# Patient Record
Sex: Female | Born: 1965
Health system: Southern US, Community
[De-identification: ages and names within clinical notes are randomized; demographics above are authoritative.]

## PROBLEM LIST (undated history)

## (undated) DIAGNOSIS — Z860100 Personal history of colon polyps, unspecified: Secondary | ICD-10-CM

## (undated) DIAGNOSIS — B019 Varicella without complication: Secondary | ICD-10-CM

## (undated) HISTORY — DX: Varicella without complication: B01.9

---
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
## 2012-04-15 LAB — HM PAP SMEAR: HM PAP: NORMAL

## 2012-04-15 LAB — HM MAMMOGRAPHY: HM Mammogram: NORMAL

## 2013-07-10 ENCOUNTER — Ambulatory Visit (INDEPENDENT_AMBULATORY_CARE_PROVIDER_SITE_OTHER): Payer: BC Managed Care – PPO | Admitting: Internal Medicine

## 2013-07-10 ENCOUNTER — Encounter: Payer: Self-pay | Admitting: Internal Medicine

## 2013-07-10 ENCOUNTER — Encounter (INDEPENDENT_AMBULATORY_CARE_PROVIDER_SITE_OTHER): Payer: Self-pay

## 2013-07-10 VITALS — BP 118/82 | HR 73 | Temp 98.0°F | Ht 64.0 in | Wt 187.1 lb

## 2013-07-10 DIAGNOSIS — E669 Obesity, unspecified: Secondary | ICD-10-CM | POA: Insufficient documentation

## 2013-07-10 DIAGNOSIS — E785 Hyperlipidemia, unspecified: Secondary | ICD-10-CM | POA: Insufficient documentation

## 2013-07-10 NOTE — Assessment & Plan Note (Signed)
I have addressed  BMI and recommended a low glycemic index diet utilizing smaller more frequent meals to increase metabolism.  I have also recommended that patient start exercising with a goal of 30 minutes of aerobic exercise a minimum of 5 days per week. Screening for lipid disorders, thyroid and diabetes to be done prior to next visit   

## 2013-07-10 NOTE — Patient Instructions (Addendum)
I recommend supplementing yor dietary Vitamin D with 1000 units of D3 daily (can increase to 2000 units during winter months) 1200 mg calcium daily is RDA for premenopusal women  Annual mammogram is recommended;  We will set that up for you and call you   This is my version of a  "Low GI"  Diet:  It will still lower your blood sugars and allow you to lose 4 to 8  lbs  per month if you follow it carefully.  Your goal with exercise is a minimum of 30 minutes of aerobic exercise 5 days per week (Walking does not count once it becomes easy!)    All of the foods can be found at grocery stores and in bulk at [employer].  The Atkins protein bars and shakes are available in more varieties at [employer], [employer] and [place].     7 AM Breakfast:  Choose from the following:  Low carbohydrate Protein  Shakes (I recommend the EAS AdvantEdge "Carb Control" shakes  Or the low carb shakes by Atkins.    2.5 carbs   Arnold's "Sandwhich Thin"toasted  w/ peanut butter (no jelly: about 20 net carbs  "Bagel Thin" with cream cheese and salmon: about 20 carbs   a scrambled egg/bacon/cheese burrito made with Mission's "carb balance" whole wheat tortilla  (about 10 net carbs )   Avoid cereal and bananas, oatmeal and cream of wheat and grits. They are loaded with carbohydrates!   10 AM: high protein snack  Protein bar by Atkins (the snack size, under 200 cal, usually < 6 net carbs).    A stick of cheese:  Around 1 carb,  100 cal     Dannon Light n Fit [place] Yogurt  (80 cal, 8 carbs)  Other so called "protein bars" and Greek yogurts tend to be loaded with carbohydrates.  Remember, in food advertising, the word "energy" is synonymous for " carbohydrate."  Lunch:   A Sandwich using the bread choices listed, Can use any  Eggs,  lunchmeat, grilled meat or canned tuna), avocado, regular mayo/mustard  and cheese.  A Salad using blue cheese, ranch,  Goddess or vinagrette,  No croutons or "confetti" and no "candied nuts"  but regular nuts OK.   No pretzels or chips.  Pickles and miniature sweet peppers are a good low carb alternative that provide a "crunch"  The bread is the only source of carbohydrate in a sandwich and  can be decreased by trying some of these alternatives to traditional loaf bread  Joseph's makes a pita bread and a flat bread that are 50 cal and 4 net carbs available at [place] and [place].  This can be toasted to use with hummous as well  Toufayan makes a low carb flatbread that's 100 cal and 9 net carbs available at [employer] and [employer]'s makes 2 sizes of  Low carb whole wheat tortilla  (The large one is 210 cal and 6 net carbs) Avoid "Low fat dressings, as well as Barry Brunner and [place] dressings They are loaded with sugar!   3 PM/ Mid day  Snack:  Consider  1 ounce of  almonds, walnuts, pistachios, pecans, peanuts,  Macadamia nuts or a nut medley.  Avoid "granola"; the dried cranberries and raisins are loaded with carbohydrates. Mixed nuts as long as there are no raisins,  cranberries or dried fruit.     6 PM  Dinner:     Meat/fowl/fish with a green salad, and  either broccoli, cauliflower, green beans, spinach, brussel sprouts or  Lima beans. DO NOT BREAD THE PROTEIN!!      There is a low carb pasta by Dreamfield's that is acceptable and tastes great: only 5 digestible carbs/serving.( All grocery stores but BJs carry it )  Try Hurley Cisco Angelo's chicken piccata or chicken or eggplant parm over low carb pasta.(Lowes and BJs)   Marjory Lies Sanchez's "Carnitas" (pulled pork, no sauce,  0 carbs) or his beef pot roast to make a dinner burrito (at [employer]J's)  Pesto over low carb pasta (bj's sells a good quality pesto in the center refrigerated section of the deli   Whole wheat pasta is still full of digestible carbs and  Not as low in glycemic index as Dreamfield's.   Brown rice is still rice,  So skip the rice and noodles if you eat [place] or [place] (or at least limit to 1/2 cup)  9 PM snack :    Breyer's "low carb" fudgsicle or  ice cream bar (Carb Smart line), or  Weight Watcher's ice cream bar , or another "no sugar added" ice cream;  a serving of fresh berries/cherries with whipped cream   Cheese or DANNON'S LlGHT N FIT [place] YOGURT  Avoid bananas, pineapple, grapes  and watermelon on a regular basis because they are high in sugar.  THINK OF THEM AS DESSERT  Remember that snack Substitutions should be less than 10 NET carbs per serving and meals < 20 carbs. Remember to subtract fiber grams to get the "net carbs."

## 2013-07-10 NOTE — Assessment & Plan Note (Signed)
Recommended repeating after 3 month of low GI diet.

## 2013-07-10 NOTE — Progress Notes (Signed)
Patient ID: Kathie Dike, female   DOB: Apr 26, 1966, 48 y.o.   MRN: [id number]  Patient Active Problem List   Diagnosis Date Noted  . Other and unspecified hyperlipidemia 07/10/2013  . Obesity, unspecified 07/10/2013    Subjective:  CC:   Chief Complaint  Patient presents with  . Establish Care    new patient    HPI:   Marin Wisner is a 48 y.o. female who presents as a new patient to establish primary care with the chief complaint of  Hyperlipidemia and obesity.    Mild hyperlipidemia and hypertriglyceridemia noted during prior annual exam.   She has lost 8 lbs since January 1st, Working on weight reduction  By removing sweets,  Increasing intake of vegetables and fruits.  Her goal is 150 lbs by summer.  She has a history of successful weight loss 5 yrs ago on the Atkins Diet but the diet was not sustainable because of she missed having fruit,  Coffee and the occasional diet soda.  She is not exercising regularly yet and cooks desserts for husband who does not have a weight problem. Likes to play tennis.     Past Medical History  Diagnosis Date  . Chicken pox as a child    Past Surgical History  Procedure Laterality Date  . Cesarean section  1994  . Wisdom tooth extraction  1985    Family History  Problem Relation Age of Onset  . Cancer Father     prostate  . Dementia Maternal Grandmother   . Heart attack Maternal Grandfather   . Heart attack Paternal Grandfather     History   Social History  . Marital Status: Married    Spouse Name: N/A    Number of Children: N/A  . Years of Education: N/A   Occupational History  . Not on file.   Social History Main Topics  . Smoking status: Never Smoker   . Smokeless tobacco: Never Used  . Alcohol Use: No  . Drug Use: No  . Sexual Activity: Yes    Partners: Male   Other Topics Concern  . Not on file   Social History Narrative  . No narrative on file    No Known Allergies   Review of Systems:   Patient  denies headache, fevers, malaise, unintentional weight loss, skin rash, eye pain, sinus congestion and sinus pain, sore throat, dysphagia,  hemoptysis , cough, dyspnea, wheezing, chest pain, palpitations, orthopnea, edema, abdominal pain, nausea, melena, diarrhea, constipation, flank pain, dysuria, hematuria, urinary  Frequency, nocturia, numbness, tingling, seizures,  Focal weakness, Loss of consciousness,  Tremor, insomnia, depression, anxiety, and suicidal ideation.    Objective:  BP 118/82  Pulse 73  Temp(Src) 98 F (36.7 C) (Oral)  Ht 5\' 4"  (1.626 m)  Wt 187 lb 1.9 oz (84.877 kg)  BMI 32.10 kg/m2  SpO2 97%  LMP 07/05/2013  General appearance: alert, cooperative and appears stated age Ears: normal TM's and external ear canals both ears Throat: lips, mucosa, and tongue normal; teeth and gums normal Neck: no adenopathy, no carotid bruit, supple, symmetrical, trachea midline and thyroid not enlarged, symmetric, no tenderness/mass/nodules Back: symmetric, no curvature. ROM normal. No CVA tenderness. Lungs: clear to auscultation bilaterally Heart: regular rate and rhythm, S1, S2 normal, no murmur, click, rub or gallop Abdomen: soft, non-tender; bowel sounds normal; no masses,  no organomegaly Pulses: 2+ and symmetric Skin: Skin color, texture, turgor normal. No rashes or lesions Lymph nodes: Cervical, supraclavicular, and axillary nodes normal.  Assessment and Plan:  Other and unspecified hyperlipidemia Recommended repeating after 3 month of low GI diet.   Obesity, unspecified I have addressed  BMI and recommended a low glycemic index diet utilizing smaller more frequent meals to increase metabolism.  I have also recommended that patient start exercising with a goal of 30 minutes of aerobic exercise a minimum of 5 days per week. Screening for lipid disorders, thyroid and diabetes to be done prior to next visit    A total of 30 minutes was spent with patient more than half of which  was spent in counseling, and coordination of care.  Updated Medication List Outpatient Encounter Prescriptions as of 07/10/2013  Medication Sig  . Multiple Vitamins-Minerals (WOMENS MULTIVITAMIN PLUS PO) Take 1 tablet by mouth daily.  Marland Kitchen OVER THE COUNTER MEDICATION Mega red- takes every now and then     Orders Placed This Encounter  Procedures  . HM MAMMOGRAPHY  . HM PAP SMEAR    No Follow-up on file.

## 2013-07-10 NOTE — Progress Notes (Signed)
Pre visit review using our clinic review tool, if applicable. No additional management support is needed unless otherwise documented below in the visit note. 

## 2014-05-02 ENCOUNTER — Encounter: Payer: Self-pay | Admitting: Internal Medicine

## 2014-05-02 ENCOUNTER — Ambulatory Visit (INDEPENDENT_AMBULATORY_CARE_PROVIDER_SITE_OTHER): Payer: BC Managed Care – PPO | Admitting: Internal Medicine

## 2014-05-02 ENCOUNTER — Encounter (INDEPENDENT_AMBULATORY_CARE_PROVIDER_SITE_OTHER): Payer: Self-pay

## 2014-05-02 VITALS — BP 120/78 | HR 78 | Temp 98.4°F | Resp 16 | Ht 64.0 in | Wt 180.5 lb

## 2014-05-02 DIAGNOSIS — Z1159 Encounter for screening for other viral diseases: Secondary | ICD-10-CM

## 2014-05-02 DIAGNOSIS — E669 Obesity, unspecified: Secondary | ICD-10-CM

## 2014-05-02 DIAGNOSIS — E785 Hyperlipidemia, unspecified: Secondary | ICD-10-CM

## 2014-05-02 DIAGNOSIS — E559 Vitamin D deficiency, unspecified: Secondary | ICD-10-CM

## 2014-05-02 DIAGNOSIS — R5383 Other fatigue: Secondary | ICD-10-CM

## 2014-05-02 DIAGNOSIS — Z0000 Encounter for general adult medical examination without abnormal findings: Secondary | ICD-10-CM

## 2014-05-02 LAB — CBC WITH DIFFERENTIAL/PLATELET
BASOS ABS: 0 10*3/uL (ref 0.0–0.1)
Basophils Relative: 0.4 % (ref 0.0–3.0)
Eosinophils Absolute: 0.1 10*3/uL (ref 0.0–0.7)
Eosinophils Relative: 1.6 % (ref 0.0–5.0)
HEMATOCRIT: 36.2 % (ref 36.0–46.0)
Hemoglobin: 12.3 g/dL (ref 12.0–15.0)
LYMPHS ABS: 1.4 10*3/uL (ref 0.7–4.0)
Lymphocytes Relative: 22.5 % (ref 12.0–46.0)
MCHC: 34 g/dL (ref 30.0–36.0)
MCV: 97.2 fl (ref 78.0–100.0)
MONO ABS: 0.5 10*3/uL (ref 0.1–1.0)
Monocytes Relative: 8.2 % (ref 3.0–12.0)
Neutro Abs: 4.2 10*3/uL (ref 1.4–7.7)
Neutrophils Relative %: 67.3 % (ref 43.0–77.0)
PLATELETS: 353 10*3/uL (ref 150.0–400.0)
RBC: 3.73 Mil/uL — ABNORMAL LOW (ref 3.87–5.11)
RDW: 12.8 % (ref 11.5–15.5)
WBC: 6.2 10*3/uL (ref 4.0–10.5)

## 2014-05-02 LAB — COMPREHENSIVE METABOLIC PANEL
ALBUMIN: 4.1 g/dL (ref 3.5–5.2)
ALT: 12 U/L (ref 0–35)
AST: 16 U/L (ref 0–37)
Alkaline Phosphatase: 54 U/L (ref 39–117)
BUN: 14 mg/dL (ref 6–23)
CALCIUM: 8.7 mg/dL (ref 8.4–10.5)
CO2: 27 meq/L (ref 19–32)
CREATININE: 0.8 mg/dL (ref 0.4–1.2)
Chloride: 105 mEq/L (ref 96–112)
GFR: 87.21 mL/min (ref >60.00)
Glucose, Bld: 93 mg/dL (ref 70–99)
POTASSIUM: 4.2 meq/L (ref 3.5–5.1)
Sodium: 143 mEq/L (ref 135–145)
TOTAL PROTEIN: 7.1 g/dL (ref 6.0–8.3)
Total Bilirubin: 0.8 mg/dL (ref 0.2–1.2)

## 2014-05-02 LAB — LIPID PANEL
CHOL/HDL RATIO: 4
Cholesterol: 212 mg/dL — ABNORMAL HIGH (ref 0–200)
HDL: 51.7 mg/dL (ref >39.00)
LDL CALC: 131 mg/dL — AB (ref 0–99)
NonHDL: 160.3
Triglycerides: 146 mg/dL (ref 0.0–149.0)
VLDL: 29.2 mg/dL (ref 0.0–40.0)

## 2014-05-02 LAB — VITAMIN D 25 HYDROXY (VIT D DEFICIENCY, FRACTURES): VITD: 29.03 ng/mL — ABNORMAL LOW (ref 30.00–100.00)

## 2014-05-02 LAB — TSH: TSH: 1.14 u[IU]/mL (ref 0.35–4.50)

## 2014-05-02 NOTE — Progress Notes (Signed)
Pre-visit discussion using our clinic review tool. No additional management support is needed unless otherwise documented below in the visit note.  

## 2014-05-02 NOTE — Patient Instructions (Signed)
You had your annual  wellness exam today.  We will repeat your PAP smear in 2016   We will schedule your mammogram annually when it is due .     We will contact you with the bloodwork results

## 2014-05-03 LAB — HEPATITIS C ANTIBODY: HCV Ab: NEGATIVE

## 2014-05-04 ENCOUNTER — Encounter: Payer: Self-pay | Admitting: *Deleted

## 2014-05-05 DIAGNOSIS — Z0000 Encounter for general adult medical examination without abnormal findings: Secondary | ICD-10-CM | POA: Insufficient documentation

## 2014-05-05 DIAGNOSIS — Z0001 Encounter for general adult medical examination with abnormal findings: Secondary | ICD-10-CM | POA: Insufficient documentation

## 2014-05-05 NOTE — Assessment & Plan Note (Signed)
New ACC guidelines recommend starting patients aged 49 or higher on moderate intensity statin therapy for LDL between 70-189 and 10 yr risk of CAD > 7.5% ;  and high intensity therapy for anyone with LDL > 190 Based on these guidelines, she does not require therapy.  I have addressed  BMI and recommended a low glycemic index diet utilizing smaller more frequent meals to increase metabolism.  I have also recommended that patient start exercising with a goal of 30 minutes of aerobic exercise a minimum of 5 days per week.    Lab Results  Component Value Date   CHOL 212* 05/02/2014   HDL 51.70 05/02/2014   LDLCALC 131* 05/02/2014   TRIG 146.0 05/02/2014   CHOLHDL 4 05/02/2014

## 2014-05-05 NOTE — Assessment & Plan Note (Signed)
Annual wellness  exam was done as well as a comprehensive physical exam .  During the course of the visit the patient was educated and counseled about appropriate screening and preventive services including :  diabetes screening, lipid analysis with projected  10 year  risk for CAD , nutrition counseling, colorectal cancer screening, and recommended immunizations.  Printed recommendations for health maintenance screenings was given.   

## 2014-05-05 NOTE — Progress Notes (Signed)
Patient ID: Natalie Barr, female   DOB: [birth year], 48 y.o.   MRN: [id number]    Subjective:     Natalie Barr is a 47 y.o. female and is here for a comprehensive physical exam. The patient reports no problems.  History   Social History  . Marital Status: Married    Spouse Name: N/A    Number of Children: N/A  . Years of Education: N/A   Occupational History  . Not on file.   Social History Main Topics  . Smoking status: Never Smoker   . Smokeless tobacco: Never Used  . Alcohol Use: No  . Drug Use: No  . Sexual Activity:    Partners: Male   Other Topics Concern  . Not on file   Social History Narrative   Health Maintenance  Topic Date Due  . INFLUENZA VACCINE  01/14/2015  . PAP SMEAR  04/16/2015  . TETANUS/TDAP  07/11/2019    The following portions of the patient's history were reviewed and updated as appropriate: allergies, current medications, past family history, past medical history, past social history, past surgical history and problem list.  Review of Systems A comprehensive review of systems was negative.   Objective:   BP 120/78 mmHg  Pulse 78  Temp(Src) 98.4 F (36.9 C) (Oral)  Resp 16  Ht 5\' 4"  (1.626 m)  Wt 180 lb 8 oz (81.874 kg)  BMI 30.97 kg/m2  SpO2 98%  LMP 04/25/2014 (Approximate)  General appearance: alert, cooperative and appears stated age Head: Normocephalic, without obvious abnormality, atraumatic Eyes: conjunctivae/corneas clear. PERRL, EOM's intact. Fundi benign. Ears: normal TM's and external ear canals both ears Nose: Nares normal. Septum midline. Mucosa normal. No drainage or sinus tenderness. Throat: lips, mucosa, and tongue normal; teeth and gums normal Neck: no adenopathy, no carotid bruit, no JVD, supple, symmetrical, trachea midline and thyroid not enlarged, symmetric, no tenderness/mass/nodules Lungs: clear to auscultation bilaterally Breasts: normal appearance, no masses or tenderness Heart: regular rate and rhythm,  S1, S2 normal, no murmur, click, rub or gallop Abdomen: soft, non-tender; bowel sounds normal; no masses,  no organomegaly Extremities: extremities normal, atraumatic, no cyanosis or edema Pulses: 2+ and symmetric Skin: Skin color, texture, turgor normal. No rashes or lesions Neurologic: Alert and oriented X 3, normal strength and tone. Normal symmetric reflexes. Normal coordination and gait.   .    Assessment and Plan:   Obesity I have addressed  BMI and recommended a low glycemic index diet utilizing smaller more frequent meals to increase metabolism.  I have also recommended that patient start exercising with a goal of 30 minutes of aerobic exercise a minimum of 5 days per week. Screening for lipid disorders, thyroid and diabetes to be done today.    Hyperlipidemia LDL goal <160 New ACC guidelines recommend starting patients aged 18 or higher on moderate intensity statin therapy for LDL between 70-189 and 10 yr risk of CAD > 7.5% ;  and high intensity therapy for anyone with LDL > 190 Based on these guidelines, she does not require therapy.  I have addressed  BMI and recommended a low glycemic index diet utilizing smaller more frequent meals to increase metabolism.  I have also recommended that patient start exercising with a goal of 30 minutes of aerobic exercise a minimum of 5 days per week.    Lab Results  Component Value Date   CHOL 212* 05/02/2014   HDL 51.70 05/02/2014   LDLCALC 131* 05/02/2014   TRIG 146.0 05/02/2014  CHOLHDL 4 05/02/2014     Encounter for preventive health examination Annual wellness  exam was done as well as a comprehensive physical exam  .  During the course of the visit the patient was educated and counseled about appropriate screening and preventive services including :  diabetes screening, lipid analysis with projected  10 year  risk for CAD , nutrition counseling, colorectal cancer screening, and recommended immunizations.  Printed recommendations for  health maintenance screenings was given.     Updated Medication List Outpatient Encounter Prescriptions as of 05/02/2014  Medication Sig  . Multiple Vitamins-Minerals (WOMENS MULTIVITAMIN PLUS PO) Take 1 tablet by mouth daily.  Marland Kitchen OVER THE COUNTER MEDICATION Mega red- takes every now and then

## 2014-05-05 NOTE — Assessment & Plan Note (Signed)
I have addressed  BMI and recommended a low glycemic index diet utilizing smaller more frequent meals to increase metabolism.  I have also recommended that patient start exercising with a goal of 30 minutes of aerobic exercise a minimum of 5 days per week. Screening for lipid disorders, thyroid and diabetes to be done today.   

## 2015-05-24 ENCOUNTER — Encounter: Payer: BC Managed Care – PPO | Admitting: Internal Medicine

## 2015-07-08 ENCOUNTER — Encounter: Payer: Self-pay | Admitting: Internal Medicine

## 2015-08-02 ENCOUNTER — Encounter: Payer: Self-pay | Admitting: Internal Medicine

## 2015-08-23 ENCOUNTER — Other Ambulatory Visit (HOSPITAL_COMMUNITY)
Admission: RE | Admit: 2015-08-23 | Discharge: 2015-08-23 | Disposition: A | Discharge status: Home / Self Care | Payer: BLUE CROSS/BLUE SHIELD | Source: Ambulatory Visit | Attending: Internal Medicine | Admitting: Internal Medicine

## 2015-08-23 ENCOUNTER — Encounter: Payer: Self-pay | Admitting: Internal Medicine

## 2015-08-23 ENCOUNTER — Ambulatory Visit (INDEPENDENT_AMBULATORY_CARE_PROVIDER_SITE_OTHER): Payer: BLUE CROSS/BLUE SHIELD | Admitting: Internal Medicine

## 2015-08-23 VITALS — BP 128/80 | HR 91 | Temp 98.0°F | Resp 12 | Ht 63.5 in | Wt 193.8 lb

## 2015-08-23 DIAGNOSIS — R5383 Other fatigue: Secondary | ICD-10-CM | POA: Diagnosis not present

## 2015-08-23 DIAGNOSIS — Z1151 Encounter for screening for human papillomavirus (HPV): Secondary | ICD-10-CM | POA: Insufficient documentation

## 2015-08-23 DIAGNOSIS — Z1159 Encounter for screening for other viral diseases: Secondary | ICD-10-CM

## 2015-08-23 DIAGNOSIS — Z1239 Encounter for other screening for malignant neoplasm of breast: Secondary | ICD-10-CM

## 2015-08-23 DIAGNOSIS — E669 Obesity, unspecified: Secondary | ICD-10-CM

## 2015-08-23 DIAGNOSIS — Z124 Encounter for screening for malignant neoplasm of cervix: Secondary | ICD-10-CM

## 2015-08-23 DIAGNOSIS — Z114 Encounter for screening for human immunodeficiency virus [HIV]: Secondary | ICD-10-CM

## 2015-08-23 DIAGNOSIS — Z0000 Encounter for general adult medical examination without abnormal findings: Secondary | ICD-10-CM

## 2015-08-23 DIAGNOSIS — E785 Hyperlipidemia, unspecified: Secondary | ICD-10-CM

## 2015-08-23 DIAGNOSIS — E559 Vitamin D deficiency, unspecified: Secondary | ICD-10-CM | POA: Diagnosis not present

## 2015-08-23 DIAGNOSIS — Z01419 Encounter for gynecological examination (general) (routine) without abnormal findings: Secondary | ICD-10-CM | POA: Insufficient documentation

## 2015-08-23 LAB — COMPREHENSIVE METABOLIC PANEL
ALT: 15 U/L (ref 0–35)
AST: 16 U/L (ref 0–37)
Albumin: 4.3 g/dL (ref 3.5–5.2)
Alkaline Phosphatase: 65 U/L (ref 39–117)
BUN: 13 mg/dL (ref 6–23)
CALCIUM: 9.4 mg/dL (ref 8.4–10.5)
CHLORIDE: 101 meq/L (ref 96–112)
CO2: 28 meq/L (ref 19–32)
Creatinine, Ser: 0.7 mg/dL (ref 0.40–1.20)
GFR: 93.94 mL/min (ref >60.00)
GLUCOSE: 96 mg/dL (ref 70–99)
POTASSIUM: 3.9 meq/L (ref 3.5–5.1)
Sodium: 138 mEq/L (ref 135–145)
Total Bilirubin: 0.4 mg/dL (ref 0.2–1.2)
Total Protein: 7.6 g/dL (ref 6.0–8.3)

## 2015-08-23 LAB — CBC WITH DIFFERENTIAL/PLATELET
BASOS PCT: 0.4 % (ref 0.0–3.0)
Basophils Absolute: 0 10*3/uL (ref 0.0–0.1)
EOS PCT: 1.4 % (ref 0.0–5.0)
Eosinophils Absolute: 0.1 10*3/uL (ref 0.0–0.7)
HEMATOCRIT: 38.8 % (ref 36.0–46.0)
HEMOGLOBIN: 13.2 g/dL (ref 12.0–15.0)
LYMPHS PCT: 25.1 % (ref 12.0–46.0)
Lymphs Abs: 1.9 10*3/uL (ref 0.7–4.0)
MCHC: 34 g/dL (ref 30.0–36.0)
MCV: 95.9 fl (ref 78.0–100.0)
MONOS PCT: 7.7 % (ref 3.0–12.0)
Monocytes Absolute: 0.6 10*3/uL (ref 0.1–1.0)
Neutro Abs: 4.9 10*3/uL (ref 1.4–7.7)
Neutrophils Relative %: 65.4 % (ref 43.0–77.0)
Platelets: 369 10*3/uL (ref 150.0–400.0)
RBC: 4.05 Mil/uL (ref 3.87–5.11)
RDW: 12.7 % (ref 11.5–15.5)
WBC: 7.5 10*3/uL (ref 4.0–10.5)

## 2015-08-23 LAB — LIPID PANEL
Cholesterol: 241 mg/dL — ABNORMAL HIGH (ref 0–200)
HDL: 60.6 mg/dL (ref >39.00)
LDL Cholesterol: 157 mg/dL — ABNORMAL HIGH (ref 0–99)
NonHDL: 180.1
Total CHOL/HDL Ratio: 4
Triglycerides: 118 mg/dL (ref 0.0–149.0)
VLDL: 23.6 mg/dL (ref 0.0–40.0)

## 2015-08-23 LAB — VITAMIN D 25 HYDROXY (VIT D DEFICIENCY, FRACTURES): VITD: 23.89 ng/mL — ABNORMAL LOW (ref 30.00–100.00)

## 2015-08-23 LAB — TSH: TSH: 0.99 u[IU]/mL (ref 0.35–4.50)

## 2015-08-23 NOTE — Progress Notes (Signed)
Patient ID: Natalie Barr, female    DOB: [birth year]  Age: 50 y.o. MRN: [id number]  The patient is here for annual Medicare wellness examination and management of other chronic and acute problems.   The risk factors are reflected in the social history.  The roster of all physicians providing medical care to patient - is listed in the Snapshot section of the chart.  Activities of daily living:  The patient is 100% independent in all ADLs: dressing, toileting, feeding as well as independent mobility  Home safety : The patient has smoke detectors in the home. They wear seatbelts.  There are no firearms at home. There is no violence in the home.   There is no risks for hepatitis, STDs or HIV. There is no   history of blood transfusion. They have no travel history to infectious disease endemic areas of the world.  The patient has seen their dentist in the last six month. They have seen their eye doctor in the last year. They admit to slight hearing difficulty with regard to whispered voices and some television programs.  They have deferred audiologic testing in the last year.  They do not  have excessive sun exposure. Discussed the need for sun protection: hats, long sleeves and use of sunscreen if there is significant sun exposure.   Diet: the importance of a healthy diet is discussed. They do have a healthy diet.  The benefits of regular aerobic exercise were discussed. She walks 4 times per week ,  20 minutes.   Depression screen: there are no signs or vegative symptoms of depression- irritability, change in appetite, anhedonia, sadness/tearfullness.  Cognitive assessment: the patient manages all their financial and personal affairs and is actively engaged. They could relate day,date,year and events; recalled 2/3 objects at 3 minutes; performed clock-face test normally.  The following portions of the patient's history were reviewed and updated as appropriate: allergies, current medications, past  family history, past medical history,  past surgical history, past social history  and problem list.  Visual acuity was not assessed per patient preference since she has regular follow up with her ophthalmologist. Hearing and body mass index were assessed and reviewed.   During the course of the visit the patient was educated and counseled about appropriate screening and preventive services including : fall prevention , diabetes screening, nutrition counseling, colorectal cancer screening, and recommended immunizations.    CC: The primary encounter diagnosis was Breast cancer screening. Diagnoses of Hyperlipidemia, Other fatigue, Vitamin D deficiency, Need for hepatitis C screening test, Screening for HIV (human immunodeficiency virus), Cervical cancer screening, Encounter for preventive health examination, Obesity, and Hyperlipidemia LDL goal <160 were also pertinent to this visit.  History Natalie Barr has a past medical history of Chicken pox (as a child).   She has past surgical history that includes Cesarean section (1994) and Wisdom tooth extraction (1985).   Her family history includes Cancer in her father; Dementia in her maternal grandmother; Heart attack in her maternal grandfather and paternal grandfather.She reports that she has never smoked. She has never used smokeless tobacco. She reports that she does not drink alcohol or use illicit drugs.  Outpatient Prescriptions Prior to Visit  Medication Sig Dispense Refill  . Multiple Vitamins-Minerals (WOMENS MULTIVITAMIN PLUS PO) Take 1 tablet by mouth daily.    Marland Kitchen OVER THE COUNTER MEDICATION Reported on 08/23/2015     No facility-administered medications prior to visit.    Review of Systems   Patient denies headache, fevers, malaise, unintentional  weight loss, skin rash, eye pain, sinus congestion and sinus pain, sore throat, dysphagia,  hemoptysis , cough, dyspnea, wheezing, chest pain, palpitations, orthopnea, edema, abdominal pain, nausea,  melena, diarrhea, constipation, flank pain, dysuria, hematuria, urinary  Frequency, nocturia, numbness, tingling, seizures,  Focal weakness, Loss of consciousness,  Tremor, insomnia, depression, anxiety, and suicidal ideation.      Objective:  BP 128/80 mmHg  Pulse 91  Temp(Src) 98 F (36.7 C) (Oral)  Resp 12  Ht 5' 3.5" (1.613 m)  Wt 193 lb 12 oz (87.884 kg)  BMI 33.78 kg/m2  SpO2 98%  LMP 08/14/2015 (Approximate)  Physical Exam   General Appearance:    Alert, cooperative, no distress, appears stated age  Head:    Normocephalic, without obvious abnormality, atraumatic  Eyes:    PERRL, conjunctiva/corneas clear, EOM's intact, fundi    benign, both eyes  Ears:    Normal TM's and external ear canals, both ears  Nose:   Nares normal, septum midline, mucosa normal, no drainage    or sinus tenderness  Throat:   Lips, mucosa, and tongue normal; teeth and gums normal  Neck:   Supple, symmetrical, trachea midline, no adenopathy;    thyroid:  no enlargement/tenderness/nodules; no carotid   bruit or JVD  Back:     Symmetric, no curvature, ROM normal, no CVA tenderness  Lungs:     Clear to auscultation bilaterally, respirations unlabored  Chest Wall:    No tenderness or deformity   Heart:    Regular rate and rhythm, S1 and S2 normal, no murmur, rub   or gallop  Breast Exam:    No tenderness, masses, or nipple abnormality  Abdomen:     Soft, non-tender, bowel sounds active all four quadrants,    no masses, no organomegaly  Genitalia:    Pelvic: cervix normal in appearance, external genitalia normal, no adnexal masses or tenderness, no cervical motion tenderness, rectovaginal septum normal, uterus normal size, shape, and consistency and vagina normal without discharge  Extremities:   Extremities normal, atraumatic, no cyanosis or edema  Pulses:   2+ and symmetric all extremities  Skin:   Skin color, texture, turgor normal, no rashes or lesions  Lymph nodes:   Cervical, supraclavicular,  and axillary nodes normal  Neurologic:   CNII-XII intact, normal strength, sensation and reflexes    throughout     Assessment & Plan:   Problem List Items Addressed This Visit    Encounter for preventive health examination (Chronic)    Annual comprehensive preventive exam was done as well as an evaluation and management of chronic conditions .  During the course of the visit the patient was educated and counseled about appropriate screening and preventive services including :  diabetes screening, lipid analysis with projected  10 year  risk for CAD  Which is 4.1 % using the Framingham risk calculator for women, , nutrition counseling, colorectal cancer screening, and recommended immunizations.  Printed recommendations for health maintenance screenings was given.        Hyperlipidemia LDL goal <160    LDL and triglycerides are at goal without medications. 10 yr risk of CAD  is 5.1%       Obesity    I have addressed  BMI and recommended wt loss of 10% of body weight over the next 6 months using a low glycemic index diet and regular exercise a minimum of 5 days per week.         Other Visit Diagnoses  Breast cancer screening    -  Primary    Relevant Orders    MM DIGITAL SCREENING BILATERAL    Hyperlipidemia        Relevant Orders    Lipid panel (Completed)    Other fatigue        Relevant Orders    Comprehensive metabolic panel (Completed)    TSH (Completed)    CBC with Differential/Platelet (Completed)    Vitamin D deficiency        Relevant Orders    VITAMIN D 25 Hydroxy (Vit-D Deficiency, Fractures) (Completed)    Need for hepatitis C screening test        Relevant Orders    Hepatitis C antibody (Completed)    Screening for HIV (human immunodeficiency virus)        Relevant Orders    HIV antibody (Completed)    Cervical cancer screening        Relevant Orders    Cytology - PAP       I have discontinued Ms. Dillingham's OVER THE COUNTER MEDICATION. I am also having  her maintain her Multiple Vitamins-Minerals (WOMENS MULTIVITAMIN PLUS PO).  No orders of the defined types were placed in this encounter.    Medications Discontinued During This Encounter  Medication Reason  . OVER THE COUNTER MEDICATION Error    Follow-up: No Follow-up on file.   Crecencio Mc, MD

## 2015-08-23 NOTE — Progress Notes (Signed)
Pre-visit discussion using our clinic review tool. No additional management support is needed unless otherwise documented below in the visit note.  

## 2015-08-23 NOTE — Patient Instructions (Signed)
This is my  example of a  "Low GI"  Diet:  It will allow you to lose 4 to 8  lbs  per month if you follow it carefully.  Your goal with exercise is a minimum of 30 minutes of aerobic exercise 5 days per week (Walking does not count once it becomes easy!)    All of the foods can be found at grocery stores and in bulk at [employer].  The Atkins protein bars and shakes are available in more varieties at [employer], [employer] and [place].     7 AM Breakfast:  Choose from the following:  Low carbohydrate Protein  Shakes (I recommend the  Premier Protein chocolate shake, s EAS AdvantEdge "Carb Control" shakes  Or the low carb shakes by Atkins.    2.5 carbs)   Arnold's "Sandwhich Thin"toasted  w/ peanut butter (no jelly: about 20 net carbs  "Bagel Thin" with cream cheese and salmon: about 20 carbs   a scrambled egg/bacon/cheese burrito made with Mission's "carb balance" whole wheat tortilla  (about 10 net carbs )  Regulatory affairs officer (basically a quiche without the pastry crust) that is eaten cold and very convenient way to get your eggs  If you make your own shakes, avoid bananas and pineapple,  And use low carb greek yogurt or almond milk    Avoid cereal and bananas, oatmeal and cream of wheat and grits. They are loaded with carbohydrates!   10 AM: high protein snack:  Protein bar by Atkins (the snack size, under 200 cal, usually < 6 net carbs).    A stick of cheese:  Around 1 carb,  100 cal     Dannon Light n Fit [place] Yogurt  (80 cal, 8 carbs)  Other so called "protein bars" and Greek yogurts tend to be loaded with carbohydrates.  Remember, in food advertising, the word "energy" is synonymous for " carbohydrate."  Lunch:   A Sandwich using the bread choices listed, Can use any  Eggs,  lunchmeat, grilled meat or canned tuna), avocado, regular mayo/mustard  and cheese.  A Salad using blue cheese, ranch,  Goddess or vinagrette,  Avoid taco shells, croutons or "confetti" and no  "candied nuts" but regular nuts OK.   No pretzels, nabs  or chips.  Pickles and miniature sweet peppers are a good low carb alternative that provide a "crunch"  The bread is the only source of carbohydrate in a sandwich and  can be decreased by trying some of these alternatives to traditional loaf bread:  Joseph's makes a pita bread and a flat bread that are 50 cal and 4 net carbs available at [place] and [place].  This can be toasted to use with hummous as well  Toufayan makes a low carb flatbread that's 100 cal and 9 net carbs available at [employer] and [employer]J's makes 2 sizes of  Low carb whole wheat tortilla  (The large one is 210 cal and 6 net carbs)  Ezekiel bread is a loaf bread sold in the frozen section of higher end grocery chains,  Very low carb  Avoid "Low fat dressings, as well as Barry Brunner and [place] dressings They are loaded with sugar!   3 PM/ Mid day  Snack:  Consider  1 ounce of  almonds, walnuts, pistachios, pecans, peanuts,  Macadamia nuts or a nut medley.  Avoid "granola"; the dried cranberries and raisins are loaded with carbohydrates. Mixed nuts as long as there  are no raisins,  cranberries or dried fruit.    Try the prosciutto/mozzarella cheese sticks by Fiorruci  In deli /backery section   High protein      6 PM  Dinner:     Meat/fowl/fish with a green salad, and either broccoli, cauliflower, green beans, spinach, brussel sprouts or  Lima beans. DO NOT BREAD THE PROTEIN!!      There is a low carb pasta by Dreamfield's that is acceptable and tastes great: only 5 digestible carbs/serving.( All grocery stores but BJs carry it )  Try Hurley Cisco Angelo's chicken piccata or chicken or eggplant parm over low carb pasta.(Lowes and BJs)   Marjory Lies Sanchez's "Carnitas" (pulled pork, no sauce,  0 carbs) or his beef pot roast to make a dinner burrito (at BJ's)  Pesto over low carb pasta (bj's sells a good quality pesto in the center refrigerated section of the deli   Try  satueeing  Cheral Marker with mushroooms  Whole wheat pasta is still full of digestible carbs and  Not as low in glycemic index as Dreamfield's.   Brown rice is still rice,  So skip the rice and noodles if you eat [place] or [place] (or at least limit to 1/2 cup)  9 PM snack :   Breyer's "low carb" fudgsicle or  ice cream bar (Carb Smart line), or  Weight Watcher's ice cream bar , or another "no sugar added" ice cream;  a serving of fresh berries/cherries with whipped cream   Cheese or DANNON'S LlGHT N FIT GREEK YOGURT  8 ounces of [place] unsweetened almond/cococunut milk    Treat yourself to a parfait made with whipped cream blueberiies, walnuts and vanilla greek yogurt  Avoid bananas, pineapple, grapes  and watermelon on a regular basis because they are high in sugar.  THINK OF THEM AS DESSERT  Remember that snack Substitutions should be less than 10 NET carbs per serving and meals < 20 carbs. Remember to subtract fiber grams to get the "net carbs."    DIET #2  THE GREEN SMOOTHIE DIET  The  Other diet I discussed with you today is the 10 day Green Smoothie Cleansing /Detox Diet by Linden Dolin . available on [place] for around $10.  It does require a blender, (Vita Mix, a electric juicer,  Or a Nutribullet Rx).  This is not a low carb or a weight loss diet,  It is fundamentally a "cleansing" low fat diet that eliminates sugar, gluten, caffeine, alcohol and dairy for 10 days .  What you add back after the initial ten days is entirely up to  you!  You can expect to lose 5 to 10 lbs depending on how strict you are.   I found that  drinking 2 smoothies or juices  daily and keeping one chewable meal (but keep it simple, like baked fish and salad, rice or bok choy) kept me satisfied and kept me from straying  .  You snack primarily on fresh  fruit, egg whites and judicious quantities of nuts.  You can add a  vegetable based protein powder  to any smoothie made with almond milk (nothing with whey ,  since whey is dairy)  WalMart has a few but  the [employer] has the greatest  selection .  Using frozen fruits is much more convenient and cost effective. You can even find plenty of organic fruit in the frozen fruit section of BJS's.  Just thaw what you need for the following day  the night before in the refrigerator (to avoid jamming up your machine)   The organic vegan protein powder I tried  is called Vega" and I found it at [employer] .  It is sugar free. Tastes like crap.  My advice:  Sarina Ser your protein  (eat an egg or two in the am with your smoothie or add soy yogurt for protein ) ,  Don't ruin the taste of your smoothies with protein powder unless you can find one you really love.

## 2015-08-24 LAB — HEPATITIS C ANTIBODY: HCV AB: NEGATIVE

## 2015-08-24 LAB — HIV ANTIBODY (ROUTINE TESTING W REFLEX): HIV: NONREACTIVE

## 2015-08-25 DIAGNOSIS — E559 Vitamin D deficiency, unspecified: Secondary | ICD-10-CM | POA: Insufficient documentation

## 2015-08-25 MED ORDER — ERGOCALCIFEROL 1.25 MG (50000 UT) PO CAPS: 50000.0000 [IU] | ORAL_CAPSULE | ORAL | Status: DC

## 2015-08-25 NOTE — Assessment & Plan Note (Signed)
Annual comprehensive preventive exam was done as well as an evaluation and management of chronic conditions .  During the course of the visit the patient was educated and counseled about appropriate screening and preventive services including :  diabetes screening, lipid analysis with projected  10 year  risk for CAD  Which is 4.1 % using the Framingham risk calculator for women, , nutrition counseling, colorectal cancer screening, and recommended immunizations.  Printed recommendations for health maintenance screenings was given.

## 2015-08-25 NOTE — Assessment & Plan Note (Signed)
LDL and triglycerides are at goal without medications. 10 yr risk of CAD  is 5.1%

## 2015-08-25 NOTE — Assessment & Plan Note (Signed)
I have addressed  BMI and recommended wt loss of 10% of body weight over the next 6 months using a low glycemic index diet and regular exercise a minimum of 5 days per week.   

## 2015-08-26 ENCOUNTER — Encounter: Payer: Self-pay | Admitting: *Deleted

## 2015-08-26 LAB — CYTOLOGY - PAP

## 2015-09-03 NOTE — Addendum Note (Signed)
Addended by: Kerin Salen R on: 09/03/2015 11:01 AM   Modules accepted: Miquel Dunn

## 2015-09-03 NOTE — Progress Notes (Signed)
Cologuard ordered

## 2015-09-23 LAB — COLOGUARD: Cologuard: NEGATIVE

## 2015-10-11 ENCOUNTER — Encounter: Payer: Self-pay | Admitting: Internal Medicine

## 2015-10-14 ENCOUNTER — Encounter: Payer: Self-pay | Admitting: *Deleted

## 2015-10-14 ENCOUNTER — Telehealth: Payer: Self-pay | Admitting: Internal Medicine

## 2015-10-14 NOTE — Telephone Encounter (Signed)
The results of your cologuard test were negative.   We will repeat this every 3 years  Until you are 84 for colon CA screening.  

## 2015-10-14 NOTE — Telephone Encounter (Signed)
Letter mailed with normal lab

## 2016-08-28 ENCOUNTER — Encounter: Payer: BLUE CROSS/BLUE SHIELD | Admitting: Internal Medicine

## 2016-09-30 ENCOUNTER — Encounter: Payer: BLUE CROSS/BLUE SHIELD | Admitting: Internal Medicine

## 2016-11-23 ENCOUNTER — Other Ambulatory Visit (HOSPITAL_COMMUNITY)
Admission: RE | Admit: 2016-11-23 | Discharge: 2016-11-23 | Disposition: A | Discharge status: Home / Self Care | Payer: BLUE CROSS/BLUE SHIELD | Source: Ambulatory Visit | Attending: Internal Medicine | Admitting: Internal Medicine

## 2016-11-23 ENCOUNTER — Ambulatory Visit (INDEPENDENT_AMBULATORY_CARE_PROVIDER_SITE_OTHER): Payer: BLUE CROSS/BLUE SHIELD | Admitting: Internal Medicine

## 2016-11-23 ENCOUNTER — Encounter: Payer: Self-pay | Admitting: Internal Medicine

## 2016-11-23 VITALS — BP 118/80 | HR 80 | Temp 99.0°F | Resp 15 | Ht 63.25 in | Wt 196.8 lb

## 2016-11-23 DIAGNOSIS — Z0000 Encounter for general adult medical examination without abnormal findings: Secondary | ICD-10-CM | POA: Diagnosis not present

## 2016-11-23 DIAGNOSIS — N761 Subacute and chronic vaginitis: Secondary | ICD-10-CM

## 2016-11-23 DIAGNOSIS — Z1239 Encounter for other screening for malignant neoplasm of breast: Secondary | ICD-10-CM

## 2016-11-23 DIAGNOSIS — E7800 Pure hypercholesterolemia, unspecified: Secondary | ICD-10-CM

## 2016-11-23 DIAGNOSIS — Z1231 Encounter for screening mammogram for malignant neoplasm of breast: Secondary | ICD-10-CM | POA: Diagnosis not present

## 2016-11-23 DIAGNOSIS — B373 Candidiasis of vulva and vagina: Secondary | ICD-10-CM | POA: Diagnosis not present

## 2016-11-23 DIAGNOSIS — E785 Hyperlipidemia, unspecified: Secondary | ICD-10-CM | POA: Diagnosis not present

## 2016-11-23 DIAGNOSIS — E6609 Other obesity due to excess calories: Secondary | ICD-10-CM | POA: Diagnosis not present

## 2016-11-23 DIAGNOSIS — Z6834 Body mass index (BMI) 34.0-34.9, adult: Secondary | ICD-10-CM

## 2016-11-23 DIAGNOSIS — Z124 Encounter for screening for malignant neoplasm of cervix: Secondary | ICD-10-CM

## 2016-11-23 DIAGNOSIS — R5383 Other fatigue: Secondary | ICD-10-CM | POA: Diagnosis not present

## 2016-11-23 LAB — COMPREHENSIVE METABOLIC PANEL
ALBUMIN: 4.6 g/dL (ref 3.5–5.2)
ALK PHOS: 64 U/L (ref 39–117)
ALT: 13 U/L (ref 0–35)
AST: 15 U/L (ref 0–37)
BILIRUBIN TOTAL: 0.5 mg/dL (ref 0.2–1.2)
BUN: 13 mg/dL (ref 6–23)
CALCIUM: 9.4 mg/dL (ref 8.4–10.5)
CO2: 27 mEq/L (ref 19–32)
Chloride: 102 mEq/L (ref 96–112)
Creatinine, Ser: 0.75 mg/dL (ref 0.40–1.20)
GFR: 86.32 mL/min (ref >60.00)
Glucose, Bld: 88 mg/dL (ref 70–99)
POTASSIUM: 4.1 meq/L (ref 3.5–5.1)
Sodium: 137 mEq/L (ref 135–145)
TOTAL PROTEIN: 7.6 g/dL (ref 6.0–8.3)

## 2016-11-23 LAB — CBC WITH DIFFERENTIAL/PLATELET
BASOS PCT: 0.9 % (ref 0.0–3.0)
Basophils Absolute: 0.1 10*3/uL (ref 0.0–0.1)
EOS PCT: 1.5 % (ref 0.0–5.0)
Eosinophils Absolute: 0.2 10*3/uL (ref 0.0–0.7)
HCT: 40.8 % (ref 36.0–46.0)
Hemoglobin: 13.6 g/dL (ref 12.0–15.0)
LYMPHS ABS: 2.2 10*3/uL (ref 0.7–4.0)
Lymphocytes Relative: 19 % (ref 12.0–46.0)
MCHC: 33.2 g/dL (ref 30.0–36.0)
MCV: 98.6 fl (ref 78.0–100.0)
MONO ABS: 0.8 10*3/uL (ref 0.1–1.0)
MONOS PCT: 7.1 % (ref 3.0–12.0)
NEUTROS PCT: 71.5 % (ref 43.0–77.0)
Neutro Abs: 8.3 10*3/uL — ABNORMAL HIGH (ref 1.4–7.7)
Platelets: 353 10*3/uL (ref 150.0–400.0)
RBC: 4.14 Mil/uL (ref 3.87–5.11)
RDW: 12.5 % (ref 11.5–15.5)
WBC: 11.6 10*3/uL — ABNORMAL HIGH (ref 4.0–10.5)

## 2016-11-23 LAB — TSH: TSH: 1.5 u[IU]/mL (ref 0.35–4.50)

## 2016-11-23 LAB — LIPID PANEL
CHOLESTEROL: 222 mg/dL — AB (ref 0–200)
HDL: 53.1 mg/dL (ref >39.00)
LDL Cholesterol: 136 mg/dL — ABNORMAL HIGH (ref 0–99)
NONHDL: 168.4
TRIGLYCERIDES: 160 mg/dL — AB (ref 0.0–149.0)
Total CHOL/HDL Ratio: 4
VLDL: 32 mg/dL (ref 0.0–40.0)

## 2016-11-23 NOTE — Patient Instructions (Addendum)
The ShingRx vaccine will be available in about 6 months and IS ADVISED for all interested adults over 50 to prevent shingles    You might want to try a premixed protein drink called Premier Protein shake for breakfast or late night snack . It is great tasting,   very low sugar and available of < $2 serving at [facility] and  In bulk for $1.50/serving at [employer] and [employer]  .    Nutritional analysis :  160 cal  30 g protein  1 g sugar 50% calcium needs    Here are several low carb protein bars that make great snacks:   Power Crunch  KIND 5 g sugar  (or Mini 3 g sugar 100 cal variety) Quest  Bars (HIGH FIBER) Atkins BARS (ALSO HIGH FIBER )   Health Maintenance, Female Adopting a healthy lifestyle and getting preventive care can go a long way to promote health and wellness. Talk with your health care provider about what schedule of regular examinations is right for you. This is a good chance for you to check in with your provider about disease prevention and staying healthy. In between checkups, there are plenty of things you can do on your own. Experts have done a lot of research about which lifestyle changes and preventive measures are most likely to keep you healthy. Ask your health care provider for more information. Weight and diet Eat a healthy diet  Be sure to include plenty of vegetables, fruits, low-fat dairy products, and lean protein.  Do not eat a lot of foods high in solid fats, added sugars, or salt.  Get regular exercise. This is one of the most important things you can do for your health. ? Most adults should exercise for at least 150 minutes each week. The exercise should increase your heart rate and make you sweat (moderate-intensity exercise). ? Most adults should also do strengthening exercises at least twice a week. This is in addition to the moderate-intensity exercise.  Maintain a healthy weight  Body mass index (BMI) is a measurement that can be used to identify  possible weight problems. It estimates body fat based on height and weight. Your health care provider can help determine your BMI and help you achieve or maintain a healthy weight.  For females 10 years of age and older: ? A BMI below 18.5 is considered underweight. ? A BMI of 18.5 to 24.9 is normal. ? A BMI of 25 to 29.9 is considered overweight. ? A BMI of 30 and above is considered obese.  Watch levels of cholesterol and blood lipids  You should start having your blood tested for lipids and cholesterol at 52 years of age, then have this test every 5 years.  You may need to have your cholesterol levels checked more often if: ? Your lipid or cholesterol levels are high. ? You are older than 52 years of age. ? You are at high risk for heart disease.  Cancer screening Lung Cancer  Lung cancer screening is recommended for adults 67-47 years old who are at high risk for lung cancer because of a history of smoking.  A yearly low-dose CT scan of the lungs is recommended for people who: ? Currently smoke. ? Have quit within the past 15 years. ? Have at least a 30-pack-year history of smoking. A pack year is smoking an average of one pack of cigarettes a day for 1 year.  Yearly screening should continue until it has been 15  years since you quit.  Yearly screening should stop if you develop a health problem that would prevent you from having lung cancer treatment.  Breast Cancer  Practice breast self-awareness. This means understanding how your breasts normally appear and feel.  It also means doing regular breast self-exams. Let your health care provider know about any changes, no matter how small.  If you are in your 20s or 30s, you should have a clinical breast exam (CBE) by a health care provider every 1-3 years as part of a regular health exam.  If you are 59 or older, have a CBE every year. Also consider having a breast X-ray (mammogram) every year.  If you have a family history  of breast cancer, talk to your health care provider about genetic screening.  If you are at high risk for breast cancer, talk to your health care provider about having an MRI and a mammogram every year.  Breast cancer gene (BRCA) assessment is recommended for women who have family members with BRCA-related cancers. BRCA-related cancers include: ? Breast. ? Ovarian. ? Tubal. ? Peritoneal cancers.  Results of the assessment will determine the need for genetic counseling and BRCA1 and BRCA2 testing.  Cervical Cancer Your health care provider may recommend that you be screened regularly for cancer of the pelvic organs (ovaries, uterus, and vagina). This screening involves a pelvic examination, including checking for microscopic changes to the surface of your cervix (Pap test). You may be encouraged to have this screening done every 3 years, beginning at age 80.  For women ages 55-65, health care providers may recommend pelvic exams and Pap testing every 3 years, or they may recommend the Pap and pelvic exam, combined with testing for human papilloma virus (HPV), every 5 years. Some types of HPV increase your risk of cervical cancer. Testing for HPV may also be done on women of any age with unclear Pap test results.  Other health care providers may not recommend any screening for nonpregnant women who are considered low risk for pelvic cancer and who do not have symptoms. Ask your health care provider if a screening pelvic exam is right for you.  If you have had past treatment for cervical cancer or a condition that could lead to cancer, you need Pap tests and screening for cancer for at least 20 years after your treatment. If Pap tests have been discontinued, your risk factors (such as having a new sexual partner) need to be reassessed to determine if screening should resume. Some women have medical problems that increase the chance of getting cervical cancer. In these cases, your health care provider  may recommend more frequent screening and Pap tests.  Colorectal Cancer  This type of cancer can be detected and often prevented.  Routine colorectal cancer screening usually begins at 52 years of age and continues through 52 years of age.  Your health care provider may recommend screening at an earlier age if you have risk factors for colon cancer.  Your health care provider may also recommend using home test kits to check for hidden blood in the stool.  A small camera at the end of a tube can be used to examine your colon directly (sigmoidoscopy or colonoscopy). This is done to check for the earliest forms of colorectal cancer.  Routine screening usually begins at age 52.  Direct examination of the colon should be repeated every 5-10 years through 52 years of age. However, you may need to be screened more often  if early forms of precancerous polyps or small growths are found.  Skin Cancer  Check your skin from head to toe regularly.  Tell your health care provider about any new moles or changes in moles, especially if there is a change in a mole's shape or color.  Also tell your health care provider if you have a mole that is larger than the size of a pencil eraser.  Always use sunscreen. Apply sunscreen liberally and repeatedly throughout the day.  Protect yourself by wearing long sleeves, pants, a wide-brimmed hat, and sunglasses whenever you are outside.  Heart disease, diabetes, and high blood pressure  High blood pressure causes heart disease and increases the risk of stroke. High blood pressure is more likely to develop in: ? People who have blood pressure in the high end of the normal range (130-139/85-89 mm Hg). ? People who are overweight or obese. ? People who are African American.  If you are 59-20 years of age, have your blood pressure checked every 3-5 years. If you are 3 years of age or older, have your blood pressure checked every year. You should have your  blood pressure measured twice-once when you are at a hospital or clinic, and once when you are not at a hospital or clinic. Record the average of the two measurements. To check your blood pressure when you are not at a hospital or clinic, you can use: ? An automated blood pressure machine at a pharmacy. ? A home blood pressure monitor.  If you are between 28 years and 82 years old, ask your health care provider if you should take aspirin to prevent strokes.  Have regular diabetes screenings. This involves taking a blood sample to check your fasting blood sugar level. ? If you are at a normal weight and have a low risk for diabetes, have this test once every three years after 52 years of age. ? If you are overweight and have a high risk for diabetes, consider being tested at a younger age or more often. Preventing infection Hepatitis B  If you have a higher risk for hepatitis B, you should be screened for this virus. You are considered at high risk for hepatitis B if: ? You were born in a country where hepatitis B is common. Ask your health care provider which countries are considered high risk. ? Your parents were born in a high-risk country, and you have not been immunized against hepatitis B (hepatitis B vaccine). ? You have HIV or AIDS. ? You use needles to inject street drugs. ? You live with someone who has hepatitis B. ? You have had sex with someone who has hepatitis B. ? You get hemodialysis treatment. ? You take certain medicines for conditions, including cancer, organ transplantation, and autoimmune conditions.  Hepatitis C  Blood testing is recommended for: ? Everyone born from [birth year] through 1966. ? Anyone with known risk factors for hepatitis C.  Sexually transmitted infections (STIs)  You should be screened for sexually transmitted infections (STIs) including gonorrhea and chlamydia if: ? You are sexually active and are younger than 52 years of age. ? You are older than 52  years of age and your health care provider tells you that you are at risk for this type of infection. ? Your sexual activity has changed since you were last screened and you are at an increased risk for chlamydia or gonorrhea. Ask your health care provider if you are at risk.  If you do not  have HIV, but are at risk, it may be recommended that you take a prescription medicine daily to prevent HIV infection. This is called pre-exposure prophylaxis (PrEP). You are considered at risk if: ? You are sexually active and do not regularly use condoms or know the HIV status of your partner(s). ? You take drugs by injection. ? You are sexually active with a partner who has HIV.  Talk with your health care provider about whether you are at high risk of being infected with HIV. If you choose to begin PrEP, you should first be tested for HIV. You should then be tested every 3 months for as long as you are taking PrEP. Pregnancy  If you are premenopausal and you may become pregnant, ask your health care provider about preconception counseling.  If you may become pregnant, take 400 to 800 micrograms (mcg) of folic acid every day.  If you want to prevent pregnancy, talk to your health care provider about birth control (contraception). Osteoporosis and menopause  Osteoporosis is a disease in which the bones lose minerals and strength with aging. This can result in serious bone fractures. Your risk for osteoporosis can be identified using a bone density scan.  If you are 63 years of age or older, or if you are at risk for osteoporosis and fractures, ask your health care provider if you should be screened.  Ask your health care provider whether you should take a calcium or vitamin D supplement to lower your risk for osteoporosis.  Menopause may have certain physical symptoms and risks.  Hormone replacement therapy may reduce some of these symptoms and risks. Talk to your health care provider about whether  hormone replacement therapy is right for you. Follow these instructions at home:  Schedule regular health, dental, and eye exams.  Stay current with your immunizations.  Do not use any tobacco products including cigarettes, chewing tobacco, or electronic cigarettes.  If you are pregnant, do not drink alcohol.  If you are breastfeeding, limit how much and how often you drink alcohol.  Limit alcohol intake to no more than 1 drink per day for nonpregnant women. One drink equals 12 ounces of beer, 5 ounces of wine, or 1 ounces of hard liquor.  Do not use street drugs.  Do not share needles.  Ask your health care provider for help if you need support or information about quitting drugs.  Tell your health care provider if you often feel depressed.  Tell your health care provider if you have ever been abused or do not feel safe at home. This information is not intended to replace advice given to you by your health care provider. Make sure you discuss any questions you have with your health care provider. Document Released: 12/15/2010 Document Revised: 11/07/2015 Document Reviewed: 03/05/2015 Elsevier Interactive Patient Education  [employer].

## 2016-11-23 NOTE — Progress Notes (Signed)
Patient ID: Natalie Barr, female    DOB: November 22, 1965  Age: 51 y.o. MRN: [id number]  The patient is here for annual  examination and management of other chronic and acute problems.  Last seen March 2017  PAP was normal but no endocervical zone sample in 2017,  Repeated today  Last mammogram [facility] Nov 2013 despite being ordered last year and at prior CPE in 2015  cologuard was  negative 2017   LMP March .  Occasional hot flushes at night very mild,  Has a menstruating daughter living at home.     The risk factors are reflected in the social history.  The roster of all physicians providing medical care to patient - is listed in the Snapshot section of the chart.  Activities of daily living:  The patient is 100% independent in all ADLs: dressing, toileting, feeding as well as independent mobility  Home safety : The patient has smoke detectors in the home. They wear seatbelts.  There are no firearms at home. There is no violence in the home.   There is no risks for hepatitis, STDs or HIV. There is no   history of blood transfusion. They have no travel history to infectious disease endemic areas of the world.  The patient has seen their dentist in the last six month. They have seen their eye doctor in the last year. They admit to slight hearing difficulty with regard to whispered voices and some television programs.  They have deferred audiologic testing in the last year.  They do not  have excessive sun exposure. Discussed the need for sun protection: hats, long sleeves and use of sunscreen if there is significant sun exposure.   Diet: the importance of a healthy diet is discussed. They do have a healthy diet.  The benefits of regular aerobic exercise were discussed. She does not exercise regularly .   Depression screen: there are no signs or vegative symptoms of depression- irritability, change in appetite, anhedonia, sadness/tearfullness.  The following portions of the patient's history were  reviewed and updated as appropriate: allergies, current medications, past family history, past medical history,  past surgical history, past social history  and problem list.  Visual acuity was not assessed per patient preference since she has regular follow up with her ophthalmologist. Hearing and body mass index were assessed and reviewed.   During the course of the visit the patient was educated and counseled about appropriate screening and preventive services including : fall prevention , diabetes screening, nutrition counseling, colorectal cancer screening, and recommended immunizations.    CC: The primary encounter diagnosis was Encounter for preventive health examination. Diagnoses of Screening breast examination, Subacute vaginitis, Cervical cancer screening, Fatigue, unspecified type, Pure hypercholesterolemia, Class 1 obesity due to excess calories without serious comorbidity with body mass index (BMI) of 34.0 to 34.9 in adult, and Hyperlipidemia LDL goal <160 were also pertinent to this visit.  History Juan has a past medical history of Chicken pox (as a child).   She has a past surgical history that includes Cesarean section (1994) and Wisdom tooth extraction (1985).   Her family history includes Cancer in her father; Dementia in her maternal grandmother; Heart attack in her maternal grandfather and paternal grandfather.She reports that she has never smoked. She has never used smokeless tobacco. She reports that she does not drink alcohol or use drugs.  Outpatient Medications Prior to Visit  Medication Sig Dispense Refill  . ergocalciferol (DRISDOL) 50000 units capsule Take 1 capsule (50,000 Units  total) by mouth once a week. (Patient not taking: Reported on 11/23/2016) 12 capsule 0  . Multiple Vitamins-Minerals (WOMENS MULTIVITAMIN PLUS PO) Take 1 tablet by mouth daily.     No facility-administered medications prior to visit.     Review of Systems   Patient denies headache,  fevers, malaise, unintentional weight loss, skin rash, eye pain, sinus congestion and sinus pain, sore throat, dysphagia,  hemoptysis , cough, dyspnea, wheezing, chest pain, palpitations, orthopnea, edema, abdominal pain, nausea, melena, diarrhea, constipation, flank pain, dysuria, hematuria, urinary  Frequency, nocturia, numbness, tingling, seizures,  Focal weakness, Loss of consciousness,  Tremor, insomnia, depression, anxiety, and suicidal ideation.      Objective:   BP 118/80 (BP Location: Left Arm, Patient Position: Sitting, Cuff Size: Normal)   Pulse 80   Temp 99 F (37.2 C) (Oral)   Resp 15   Ht 5' 3.25" (1.607 m)   Wt 196 lb 12.8 oz (89.3 kg)   SpO2 97%   BMI 34.59 kg/m    Physical Exam   General Appearance:    Alert, cooperative, no distress, appears stated age  Head:    Normocephalic, without obvious abnormality, atraumatic  Eyes:    PERRL, conjunctiva/corneas clear, EOM's intact, fundi    benign, both eyes  Ears:    Normal TM's and external ear canals, both ears  Nose:   Nares normal, septum midline, mucosa normal, no drainage    or sinus tenderness  Throat:   Lips, mucosa, and tongue normal; teeth and gums normal  Neck:   Supple, symmetrical, trachea midline, no adenopathy;    thyroid:  no enlargement/tenderness/nodules; no carotid   bruit or JVD  Back:     Symmetric, no curvature, ROM normal, no CVA tenderness  Lungs:     Clear to auscultation bilaterally, respirations unlabored  Chest Wall:    No tenderness or deformity   Heart:    Regular rate and rhythm, S1 and S2 normal, no murmur, rub   or gallop  Breast Exam:    No tenderness, masses, or nipple abnormality  Abdomen:     Soft, non-tender, bowel sounds active all four quadrants,    no masses, no organomegaly  Genitalia:    Pelvic: cervix normal in appearance, external genitalia normal, no adnexal masses or tenderness, no cervical motion tenderness, rectovaginal septum normal, uterus normal size, shape, and  consistency and vagina normal without discharge  Extremities:   Extremities normal, atraumatic, no cyanosis or edema  Pulses:   2+ and symmetric all extremities  Skin:   Skin color, texture, turgor normal, no rashes or lesions  Lymph nodes:   Cervical, supraclavicular, and axillary nodes normal  Neurologic:   CNII-XII intact, normal strength, sensation and reflexes    throughout       Assessment & Plan:   Problem List Items Addressed This Visit    Subacute vaginitis    In addition to the PAP smear, she was tested for candida , vaginosis and STDS vai cervical brush due to report of increased discharge and itching over the last 1-2 weeks. Her exam was normal except for a thin milky appearing discharge.       Relevant Orders   Cytology - PAP   Obesity    BMI and weight gain addressed.  I spent 15 minutes addressing  BMI and recommended wt loss of 10% of body weight over the next 6 months using a low glycemic index diet and30 minutes of aerobic exercise a minimum of 5  days per week.   No results found for: HGBA1C Lab Results  Component Value Date   CHOL 222 (H) 11/23/2016   HDL 53.10 11/23/2016   LDLCALC 136 (H) 11/23/2016   TRIG 160.0 (H) 11/23/2016   CHOLHDL 4 11/23/2016         Hyperlipidemia LDL goal <160    10 year risk of CAD using FRC is 4.3%   Lab Results  Component Value Date   CHOL 222 (H) 11/23/2016   HDL 53.10 11/23/2016   LDLCALC 136 (H) 11/23/2016   TRIG 160.0 (H) 11/23/2016   CHOLHDL 4 11/23/2016         Encounter for preventive health examination - Primary (Chronic)    Annual comprehensive preventive exam was done as well as an evaluation and management of chronic conditions .  During the course of the visit the patient was educated and counseled about appropriate screening and preventive services including :  diabetes screening, lipid analysis with projected  10 year  risk for CAD , nutrition counseling, breast, cervical and colorectal cancer screening,  and recommended immunizations.  Printed recommendations for health maintenance screenings was given       Other Visit Diagnoses    Screening breast examination       Relevant Orders   MM SCREENING BREAST TOMO BILATERAL   Cervical cancer screening       Relevant Orders   Cytology - PAP   Fatigue, unspecified type       Relevant Orders   Comprehensive metabolic panel (Completed)   CBC with Differential/Platelet (Completed)   TSH (Completed)   Pure hypercholesterolemia       Relevant Orders   Lipid panel (Completed)      I have discontinued Ms. Aitken's Multiple Vitamins-Minerals (WOMENS MULTIVITAMIN PLUS PO) and ergocalciferol.  No orders of the defined types were placed in this encounter.   Medications Discontinued During This Encounter  Medication Reason  . ergocalciferol (DRISDOL) 50000 units capsule Therapy completed  . Multiple Vitamins-Minerals (WOMENS MULTIVITAMIN PLUS PO) Patient has not taken in last 30 days    Follow-up: No Follow-up on file.   Crecencio Mc, MD

## 2016-11-24 ENCOUNTER — Telehealth: Payer: Self-pay | Admitting: Internal Medicine

## 2016-11-24 DIAGNOSIS — N761 Subacute and chronic vaginitis: Secondary | ICD-10-CM | POA: Insufficient documentation

## 2016-11-24 DIAGNOSIS — N760 Acute vaginitis: Secondary | ICD-10-CM | POA: Insufficient documentation

## 2016-11-24 LAB — CYTOLOGY - PAP
BACTERIAL VAGINITIS: NEGATIVE
CANDIDA VAGINITIS: POSITIVE — AB
CHLAMYDIA, DNA PROBE: NEGATIVE
Diagnosis: NEGATIVE
HPV (WINDOPATH): NOT DETECTED
Neisseria Gonorrhea: NEGATIVE
Trichomonas: NEGATIVE

## 2016-11-24 NOTE — Assessment & Plan Note (Signed)
Annual comprehensive preventive exam was done as well as an evaluation and management of chronic conditions .  During the course of the visit the patient was educated and counseled about appropriate screening and preventive services including :  diabetes screening, lipid analysis with projected  10 year  risk for CAD , nutrition counseling, breast, cervical and colorectal cancer screening, and recommended immunizations.  Printed recommendations for health maintenance screenings was given 

## 2016-11-24 NOTE — Assessment & Plan Note (Signed)
In addition to the PAP smear, she was tested for candida , vaginosis and STDS vai cervical brush due to report of increased discharge and itching over the last 1-2 weeks. Her exam was normal except for a thin milky appearing discharge.

## 2016-11-24 NOTE — Assessment & Plan Note (Signed)
10 year risk of CAD using FRC is 4.3%   Lab Results  Component Value Date   CHOL 222 (H) 11/23/2016   HDL 53.10 11/23/2016   LDLCALC 136 (H) 11/23/2016   TRIG 160.0 (H) 11/23/2016   CHOLHDL 4 11/23/2016

## 2016-11-24 NOTE — Telephone Encounter (Signed)
Pt called back returning your call. Please advise, thank you!  Call pt @ [phone]

## 2016-11-24 NOTE — Telephone Encounter (Signed)
Please result note message.  

## 2016-11-24 NOTE — Assessment & Plan Note (Signed)
BMI and weight gain addressed.  I spent 15 minutes addressing  BMI and recommended wt loss of 10% of body weight over the next 6 months using a low glycemic index diet and30 minutes of aerobic exercise a minimum of 5 days per week.   No results found for: HGBA1C Lab Results  Component Value Date   CHOL 222 (H) 11/23/2016   HDL 53.10 11/23/2016   LDLCALC 136 (H) 11/23/2016   TRIG 160.0 (H) 11/23/2016   CHOLHDL 4 11/23/2016

## 2016-11-25 ENCOUNTER — Other Ambulatory Visit: Payer: Self-pay | Admitting: Internal Medicine

## 2016-11-25 MED ORDER — FLUCONAZOLE 150 MG PO TABS: 150.0000 mg | ORAL_TABLET | Freq: Every day | ORAL | 0 refills | Status: DC

## 2016-12-21 ENCOUNTER — Ambulatory Visit: Payer: BLUE CROSS/BLUE SHIELD

## 2016-12-21 ENCOUNTER — Ambulatory Visit
Admission: RE | Admit: 2016-12-21 | Discharge: 2016-12-21 | Disposition: A | Discharge status: Home / Self Care | Payer: BLUE CROSS/BLUE SHIELD | Source: Ambulatory Visit | Attending: Internal Medicine | Admitting: Internal Medicine

## 2016-12-21 DIAGNOSIS — Z1231 Encounter for screening mammogram for malignant neoplasm of breast: Secondary | ICD-10-CM | POA: Insufficient documentation

## 2016-12-21 DIAGNOSIS — Z1239 Encounter for other screening for malignant neoplasm of breast: Secondary | ICD-10-CM

## 2016-12-25 ENCOUNTER — Inpatient Hospital Stay
Admission: RE | Admit: 2016-12-25 | Discharge: 2016-12-25 | Disposition: A | Discharge status: Home / Self Care | Payer: Self-pay | Source: Ambulatory Visit | Attending: *Deleted | Admitting: *Deleted

## 2016-12-25 ENCOUNTER — Other Ambulatory Visit: Payer: Self-pay | Admitting: *Deleted

## 2016-12-25 DIAGNOSIS — Z9289 Personal history of other medical treatment: Secondary | ICD-10-CM

## 2017-02-20 ENCOUNTER — Telehealth: Payer: BLUE CROSS/BLUE SHIELD | Admitting: Family

## 2017-02-20 DIAGNOSIS — B3731 Acute candidiasis of vulva and vagina: Secondary | ICD-10-CM

## 2017-02-20 DIAGNOSIS — B373 Candidiasis of vulva and vagina: Secondary | ICD-10-CM

## 2017-02-20 MED ORDER — FLUCONAZOLE 150 MG PO TABS: 150.0000 mg | ORAL_TABLET | Freq: Every day | ORAL | 0 refills | Status: DC

## 2017-02-20 NOTE — Progress Notes (Signed)
Thank you for the details you put in the comment boxes. Those details really help [place] take better care of you. Regarding the yeast infections, the most important thing is to avoid doing things that will change the ph (acid/base) balance of your body. That means avoiding strongly scented soaps, bubble baths, bath bombs, and other female products with strong scent. Sometimes changing these products can cause it. Also, sometimes hormonal changes with menopause can indeed cause a change in ph and allow yeast to grow more. Taking antibiotics can also cause one.  We are sorry that you are not feeling well. Here is how we plan to help! Based on what you shared with me it looks like you: May have a yeast vaginosis  Vaginosis is an inflammation of the vagina that can result in discharge, itching and pain. The cause is usually a change in the normal balance of vaginal bacteria or an infection. Vaginosis can also result from reduced estrogen levels after menopause.  The most common causes of vaginosis are:   Bacterial vaginosis which results from an overgrowth of one on several organisms that are normally present in your vagina.   Yeast infections which are caused by a naturally occurring fungus called candida.   Vaginal atrophy (atrophic vaginosis) which results from the thinning of the vagina from reduced estrogen levels after menopause.   Trichomoniasis which is caused by a parasite and is commonly transmitted by sexual intercourse.  Factors that increase your risk of developing vaginosis include: Marland Kitchen Medications, such as antibiotics and steroids . Uncontrolled diabetes . Use of hygiene products such as bubble bath, vaginal spray or vaginal deodorant . Douching . Wearing damp or tight-fitting clothing . Using an intrauterine device (IUD) for birth control . Hormonal changes, such as those associated with pregnancy, birth control pills or menopause . Sexual activity . Having a sexually transmitted  infection  Your treatment plan is A single Diflucan (fluconazole) 150mg  tablet once.  I have electronically sent this prescription into the pharmacy that you have chosen.  Be sure to take all of the medication as directed. Stop taking any medication if you develop a rash, tongue swelling or shortness of breath. Mothers who are breast feeding should consider pumping and discarding their breast milk while on these antibiotics. However, there is no consensus that infant exposure at these doses would be harmful.  Remember that medication creams can weaken latex condoms. Marland Kitchen   HOME CARE:  Good hygiene may prevent some types of vaginosis from recurring and may relieve some symptoms:  . Avoid baths, hot tubs and whirlpool spas. Rinse soap from your outer genital area after a shower, and dry the area well to prevent irritation. Don't use scented or harsh soaps, such as those with deodorant or antibacterial action. Marland Kitchen Avoid irritants. These include scented tampons and pads. . Wipe from front to back after using the toilet. Doing so avoids spreading fecal bacteria to your vagina.  Other things that may help prevent vaginosis include:  Marland Kitchen Don't douche. Your vagina doesn't require cleansing other than normal bathing. Repetitive douching disrupts the normal organisms that reside in the vagina and can actually increase your risk of vaginal infection. Douching won't clear up a vaginal infection. . Use a latex condom. Both female and female latex condoms may help you avoid infections spread by sexual contact. . Wear cotton underwear. Also wear pantyhose with a cotton crotch. If you feel comfortable without it, skip wearing underwear to bed. Yeast thrives in [employer]  Your symptoms should improve in the next day or two.  GET HELP RIGHT AWAY IF:  . You have pain in your lower abdomen ( pelvic area or over your ovaries) . You develop nausea or vomiting . You develop a fever . Your discharge changes or  worsens . You have persistent pain with intercourse . You develop shortness of breath, a rapid pulse, or you faint.  These symptoms could be signs of problems or infections that need to be evaluated by a medical provider now.  MAKE SURE YOU    Understand these instructions.  Will watch your condition.  Will get help right away if you are not doing well or get worse.  Your e-visit answers were reviewed by a board certified advanced clinical practitioner to complete your personal care plan. Depending upon the condition, your plan could have included both over the counter or prescription medications. Please review your pharmacy choice to make sure that you have choses a pharmacy that is open for you to pick up any needed prescription, Your safety is important to [place]. If you have drug allergies check your prescription carefully.   You can use MyChart to ask questions about today's visit, request a non-urgent call back, or ask for a work or school excuse for 24 hours related to this e-Visit. If it has been greater than 24 hours you will need to follow up with your provider, or enter a new e-Visit to address those concerns. You will get a MyChart message within the next two days asking about your experience. I hope that your e-visit has been valuable and will speed your recovery.

## 2017-11-29 ENCOUNTER — Ambulatory Visit (INDEPENDENT_AMBULATORY_CARE_PROVIDER_SITE_OTHER): Payer: 59 | Admitting: Internal Medicine

## 2017-11-29 ENCOUNTER — Encounter: Payer: Self-pay | Admitting: Internal Medicine

## 2017-11-29 VITALS — BP 116/86 | HR 84 | Temp 97.8°F | Resp 15 | Ht 63.5 in | Wt 199.4 lb

## 2017-11-29 DIAGNOSIS — R635 Abnormal weight gain: Secondary | ICD-10-CM | POA: Diagnosis not present

## 2017-11-29 DIAGNOSIS — Z6834 Body mass index (BMI) 34.0-34.9, adult: Secondary | ICD-10-CM

## 2017-11-29 DIAGNOSIS — E6609 Other obesity due to excess calories: Secondary | ICD-10-CM

## 2017-11-29 DIAGNOSIS — E559 Vitamin D deficiency, unspecified: Secondary | ICD-10-CM | POA: Diagnosis not present

## 2017-11-29 DIAGNOSIS — Z0000 Encounter for general adult medical examination without abnormal findings: Secondary | ICD-10-CM | POA: Diagnosis not present

## 2017-11-29 DIAGNOSIS — R5383 Other fatigue: Secondary | ICD-10-CM

## 2017-11-29 LAB — CBC WITH DIFFERENTIAL/PLATELET
BASOS PCT: 0.5 % (ref 0.0–3.0)
Basophils Absolute: 0 10*3/uL (ref 0.0–0.1)
EOS PCT: 2.8 % (ref 0.0–5.0)
Eosinophils Absolute: 0.2 10*3/uL (ref 0.0–0.7)
HCT: 38.6 % (ref 36.0–46.0)
HEMOGLOBIN: 13.1 g/dL (ref 12.0–15.0)
LYMPHS ABS: 1.4 10*3/uL (ref 0.7–4.0)
Lymphocytes Relative: 19.3 % (ref 12.0–46.0)
MCHC: 34 g/dL (ref 30.0–36.0)
MCV: 97.7 fl (ref 78.0–100.0)
MONO ABS: 0.6 10*3/uL (ref 0.1–1.0)
MONOS PCT: 7.8 % (ref 3.0–12.0)
Neutro Abs: 5 10*3/uL (ref 1.4–7.7)
Neutrophils Relative %: 69.6 % (ref 43.0–77.0)
Platelets: 319 10*3/uL (ref 150.0–400.0)
RBC: 3.96 Mil/uL (ref 3.87–5.11)
RDW: 13.3 % (ref 11.5–15.5)
WBC: 7.2 10*3/uL (ref 4.0–10.5)

## 2017-11-29 LAB — LIPID PANEL
CHOL/HDL RATIO: 4
Cholesterol: 247 mg/dL — ABNORMAL HIGH (ref 0–200)
HDL: 55.8 mg/dL (ref >39.00)
NonHDL: 190.75
Triglycerides: 275 mg/dL — ABNORMAL HIGH (ref 0.0–149.0)
VLDL: 55 mg/dL — AB (ref 0.0–40.0)

## 2017-11-29 LAB — TSH: TSH: 1.19 u[IU]/mL (ref 0.35–4.50)

## 2017-11-29 LAB — COMPREHENSIVE METABOLIC PANEL
ALT: 15 U/L (ref 0–35)
AST: 13 U/L (ref 0–37)
Albumin: 4.3 g/dL (ref 3.5–5.2)
Alkaline Phosphatase: 70 U/L (ref 39–117)
BUN: 14 mg/dL (ref 6–23)
CHLORIDE: 103 meq/L (ref 96–112)
CO2: 28 meq/L (ref 19–32)
CREATININE: 0.7 mg/dL (ref 0.40–1.20)
Calcium: 9.4 mg/dL (ref 8.4–10.5)
GFR: 93.1 mL/min (ref >60.00)
GLUCOSE: 91 mg/dL (ref 70–99)
POTASSIUM: 4.3 meq/L (ref 3.5–5.1)
Sodium: 140 mEq/L (ref 135–145)
Total Bilirubin: 0.4 mg/dL (ref 0.2–1.2)
Total Protein: 7.5 g/dL (ref 6.0–8.3)

## 2017-11-29 LAB — VITAMIN D 25 HYDROXY (VIT D DEFICIENCY, FRACTURES): VITD: 33.8 ng/mL (ref 30.00–100.00)

## 2017-11-29 LAB — LDL CHOLESTEROL, DIRECT: LDL DIRECT: 139 mg/dL

## 2017-11-29 NOTE — Progress Notes (Signed)
Patient ID: Natalie Barr, female    DOB: 1965-07-10  Age: 52 y.o. MRN: [id number]  The patient is here for annual preventive  examination and management of other chronic and acute problems.  PAP 2018. LAST MENSES ONE YEAR AGO COLOGUARD 2017 MAMMOGRAM  NORMAL 3D jULY 2018  LOW VIT D      The risk factors are reflected in the social history.  The roster of all physicians providing medical care to patient - is listed in the Snapshot section of the chart.  Activities of daily living:  The patient is 100% independent in all ADLs: dressing, toileting, feeding as well as independent mobility  Home safety : The patient has smoke detectors in the home. They wear seatbelts.  There are no firearms at home. There is no violence in the home.   There is no risks for hepatitis, STDs or HIV. There is no   history of blood transfusion. They have no travel history to infectious disease endemic areas of the world.  The patient has seen their dentist in the last six month. They have seen their eye doctor in the last year.   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 She uses sun protection: hats, long sleeves and use of sunscreen if there is significant sun exposure.   Diet: the importance of a healthy diet is discussed. They do have a healthy diet.  The benefits of regular aerobic exercise were discussed. She is not exercising regularly    Depression screen: there are no signs or vegative symptoms of depression- irritability, change in appetite, anhedonia, sadness/tearfullness.  The following portions of the patient's history were reviewed and updated as appropriate: allergies, current medications, past family history, past medical history,  past surgical history, past social history  and problem list.  Visual acuity was not assessed per patient preference since she has regular follow up with her ophthalmologist. Hearing and body mass index were assessed and  reviewed.   During the course of the visit the patient was educated and counseled about appropriate screening and preventive services including : fall prevention , diabetes screening, nutrition counseling, colorectal cancer screening, and recommended immunizations.    CC: The primary encounter diagnosis was Vitamin D deficiency. Diagnoses of Fatigue, unspecified type, Weight gain, Encounter for preventive health examination, and Class 1 obesity due to excess calories without serious comorbidity with body mass index (BMI) of 34.0 to 34.9 in adult were also pertinent to this visit.  No issues today . Has entered menopause  History Annmargaret has a past medical history of Chicken pox (as a child).   She has a past surgical history that includes Cesarean section (1994) and Wisdom tooth extraction (1985).   Her family history includes Cancer in her father; Dementia in her maternal grandmother; Heart attack in her maternal grandfather and paternal grandfather.She reports that she has never smoked. She has never used smokeless tobacco. She reports that she does not drink alcohol or use drugs.  Outpatient Medications Prior to Visit  Medication Sig Dispense Refill  . fluconazole (DIFLUCAN) 150 MG tablet Take 1 tablet (150 mg total) by mouth daily. (Patient not taking: Reported on 11/29/2017) 2 tablet 0   No facility-administered medications prior to visit.     Review of Systems   Patient denies headache, fevers, malaise, unintentional weight loss, skin rash, eye pain, sinus congestion and sinus pain, sore throat, dysphagia,  hemoptysis , cough, dyspnea, wheezing, chest pain,  palpitations, orthopnea, edema, abdominal pain, nausea, melena, diarrhea, constipation, flank pain, dysuria, hematuria, urinary  Frequency, nocturia, numbness, tingling, seizures,  Focal weakness, Loss of consciousness,  Tremor, insomnia, depression, anxiety, and suicidal ideation.     Objective:  BP 116/86 (BP Location: Left Arm,  Patient Position: Sitting, Cuff Size: Large)   Pulse 84   Temp 97.8 F (36.6 C) (Oral)   Resp 15   Ht 5' 3.5" (1.613 m)   Wt 199 lb 6.4 oz (90.4 kg)   SpO2 96%   BMI 34.77 kg/m   Physical Exam   General appearance: alert, cooperative and appears stated age Head: Normocephalic, without obvious abnormality, atraumatic Eyes: conjunctivae/corneas clear. PERRL, EOM's intact. Fundi benign. Ears: normal TM's and external ear canals both ears Nose: Nares normal. Septum midline. Mucosa normal. No drainage or sinus tenderness. Throat: lips, mucosa, and tongue normal; teeth and gums normal Neck: no adenopathy, no carotid bruit, no JVD, supple, symmetrical, trachea midline and thyroid not enlarged, symmetric, no tenderness/mass/nodules Lungs: clear to auscultation bilaterally Breasts: normal appearance, no masses or tenderness Heart: regular rate and rhythm, S1, S2 normal, no murmur, click, rub or gallop Abdomen: soft, non-tender; bowel sounds normal; no masses,  no organomegaly Extremities: extremities normal, atraumatic, no cyanosis or edema Pulses: 2+ and symmetric Skin: Skin color, texture, turgor normal. No rashes or lesions Neurologic: Alert and oriented X 3, normal strength and tone. Normal symmetric reflexes. Normal coordination and gait.      Assessment & Plan:   Problem List Items Addressed This Visit    Vitamin D deficiency - Primary   Relevant Orders   VITAMIN D 25 Hydroxy (Vit-D Deficiency, Fractures)   Obesity    I have addressed  BMI and recommended a low glycemic index diet utilizing smaller more frequent meals to increase metabolism.  I have also recommended that patient start exercising with a goal of 30 minutes of aerobic exercise a minimum of 5 days per week. Screening for lipid disorders, thyroid and diabetes to be done today.        Encounter for preventive health examination (Chronic)    Annual comprehensive preventive exam was done as well as an evaluation and  management of chronic conditions .  During the course of the visit the patient was educated and counseled about appropriate screening and preventive services including :  diabetes screening, lipid analysis with projected  10 year  risk for CAD , nutrition counseling, breast, cervical and colorectal cancer screening, and recommended immunizations.  Printed recommendations for health maintenance screenings was given       Other Visit Diagnoses    Fatigue, unspecified type       Relevant Orders   CBC with Differential/Platelet   Weight gain       Relevant Orders   Lipid panel   TSH   Comprehensive metabolic panel      I have discontinued Maydell Garramone's fluconazole.  No orders of the defined types were placed in this encounter.   Medications Discontinued During This Encounter  Medication Reason  . fluconazole (DIFLUCAN) 150 MG tablet Completed Course    Follow-up: No follow-ups on file.   Crecencio Mc, MD

## 2017-11-29 NOTE — Assessment & Plan Note (Signed)
Annual comprehensive preventive exam was done as well as an evaluation and management of chronic conditions .  During the course of the visit the patient was educated and counseled about appropriate screening and preventive services including :  diabetes screening, lipid analysis with projected  10 year  risk for CAD , nutrition counseling, breast, cervical and colorectal cancer screening, and recommended immunizations.  Printed recommendations for health maintenance screenings was given 

## 2017-11-29 NOTE — Patient Instructions (Signed)
I WANT YO TO LOSE 20 LBS OVER THE NEXT 6 MONTHS TO GET YOUR BMI DOWN  Using the mediterranean diet and exercise.     I recommened using Debrox for right ear cerumen buildup (wax)    Mediterranean Diet A Mediterranean diet refers to food and lifestyle choices that are based on the traditions of countries located on the [employer]. This way of eating has been shown to help prevent certain conditions and improve outcomes for people who have chronic diseases, like kidney disease and heart disease. What are tips for following this plan? Lifestyle  Cook and eat meals together with your family, when possible.  Drink enough fluid to keep your urine clear or pale yellow.  Be physically active every day. This includes: ? Aerobic exercise like running or swimming. ? Leisure activities like gardening, walking, or housework.  Get 7-8 hours of sleep each night.  If recommended by your health care provider, drink red wine in moderation. This means 1 glass a day for nonpregnant women and 2 glasses a day for men. A glass of wine equals 5 oz (150 mL). Reading food labels  Check the serving size of packaged foods. For foods such as rice and pasta, the serving size refers to the amount of cooked product, not dry.  Check the total fat in packaged foods. Avoid foods that have saturated fat or trans fats.  Check the ingredients list for added sugars, such as corn syrup. Shopping  At the grocery store, buy most of your food from the areas near the walls of the store. This includes: ? Fresh fruits and vegetables (produce). ? Grains, beans, nuts, and seeds. Some of these may be available in unpackaged forms or large amounts (in bulk). ? Fresh seafood. ? Poultry and eggs. ? Low-fat dairy products.  Buy whole ingredients instead of prepackaged foods.  Buy fresh fruits and vegetables in-season from local farmers markets.  Buy frozen fruits and vegetables in resealable bags.  If you do not have  access to quality fresh seafood, buy precooked frozen shrimp or canned fish, such as tuna, salmon, or sardines.  Buy small amounts of raw or cooked vegetables, salads, or olives from the deli or salad bar at your store.  Stock your pantry so you always have certain foods on hand, such as olive oil, canned tuna, canned tomatoes, rice, pasta, and beans. Cooking  Cook foods with extra-virgin olive oil instead of using butter or other vegetable oils.  Have meat as a side dish, and have vegetables or grains as your main dish. This means having meat in small portions or adding small amounts of meat to foods like pasta or stew.  Use beans or vegetables instead of meat in common dishes like chili or lasagna.  Experiment with different cooking methods. Try roasting or broiling vegetables instead of steaming or sauteing them.  Add frozen vegetables to soups, stews, pasta, or rice.  Add nuts or seeds for added healthy fat at each meal. You can add these to yogurt, salads, or vegetable dishes.  Marinate fish or vegetables using olive oil, lemon juice, garlic, and fresh herbs. Meal planning  Plan to eat 1 vegetarian meal one day each week. Try to work up to 2 vegetarian meals, if possible.  Eat seafood 2 or more times a week.  Have healthy snacks readily available, such as: ? Vegetable sticks with hummus. ? [place] yogurt. ? Fruit and nut trail mix.  Eat balanced meals throughout the week. This includes: ?  Fruit: 2-3 servings a day ? Vegetables: 4-5 servings a day ? Low-fat dairy: 2 servings a day ? Fish, poultry, or lean meat: 1 serving a day ? Beans and legumes: 2 or more servings a week ? Nuts and seeds: 1-2 servings a day ? Whole grains: 6-8 servings a day ? Extra-virgin olive oil: 3-4 servings a day  Limit red meat and sweets to only a few servings a month What are my food choices?  Mediterranean diet ? Recommended ? Grains: Whole-grain pasta. Brown rice. Bulgar wheat. Polenta.  Couscous. Whole-wheat bread. [place]. ? Vegetables: Artichokes. Beets. Broccoli. Cabbage. Carrots. Eggplant. Green beans. Chard. Kale. Spinach. Onions. Leeks. Peas. Squash. Tomatoes. Peppers. Radishes. ? Fruits: Apples. Apricots. Avocado. Berries. Bananas. Cherries. Dates. Figs. Grapes. Lemons. Melon. Oranges. Peaches. Plums. Pomegranate. ? Meats and other protein foods: Beans. Almonds. Sunflower seeds. Pine nuts. Peanuts. [place]. Salmon. Scallops. Shrimp. [place]. Tilapia. Clams. Oysters. Eggs. ? Dairy: Low-fat milk. Cheese. Greek yogurt. ? Beverages: Water. Red wine. Herbal tea. ? Fats and oils: Extra virgin olive oil. Avocado oil. Grape seed oil. ? Sweets and desserts: [place] yogurt with honey. Baked apples. Poached pears. Trail mix. ? Seasoning and other foods: Basil. Cilantro. Coriander. Cumin. Mint. Parsley. Sage. Rosemary. Tarragon. Garlic. Oregano. Thyme. Pepper. Balsalmic vinegar. Tahini. Hummus. Tomato sauce. Olives. Mushrooms. ? Limit these ? Grains: Prepackaged pasta or rice dishes. Prepackaged cereal with added sugar. ? Vegetables: Deep fried potatoes (french fries). ? Fruits: Fruit canned in syrup. ? Meats and other protein foods: Beef. Pork. Lamb. Poultry with skin. Hot dogs. [place] Salines. ? Dairy: Ice cream. Sour cream. Whole milk. ? Beverages: Juice. Sugar-sweetened soft drinks. Beer. Liquor and spirits. ? Fats and oils: Butter. Canola oil. Vegetable oil. Beef fat (tallow). Lard. ? Sweets and desserts: Cookies. Cakes. Pies. Candy. ? Seasoning and other foods: Mayonnaise. Premade sauces and marinades. ? The items listed may not be a complete list. Talk with your dietitian about what dietary choices are right for you. Summary  The Mediterranean diet includes both food and lifestyle choices.  Eat a variety of fresh fruits and vegetables, beans, nuts, seeds, and whole grains.  Limit the amount of red meat and sweets that you eat.  Talk with your health care provider about whether  it is safe for you to drink red wine in moderation. This means 1 glass a day for nonpregnant women and 2 glasses a day for men. A glass of wine equals 5 oz (150 mL). This information is not intended to replace advice given to you by your health care provider. Make sure you discuss any questions you have with your health care provider. Document Released: 01/23/2016 Document Revised: 02/25/2016 Document Reviewed: 01/23/2016 Elsevier Interactive Patient Education  [employer].

## 2017-11-29 NOTE — Assessment & Plan Note (Signed)
I have addressed  BMI and recommended a low glycemic index diet utilizing smaller more frequent meals to increase metabolism.  I have also recommended that patient start exercising with a goal of 30 minutes of aerobic exercise a minimum of 5 days per week. Screening for lipid disorders, thyroid and diabetes to be done today.   

## 2017-11-30 ENCOUNTER — Telehealth: Payer: Self-pay | Admitting: Internal Medicine

## 2017-11-30 DIAGNOSIS — E785 Hyperlipidemia, unspecified: Secondary | ICD-10-CM

## 2017-11-30 MED ORDER — ERGOCALCIFEROL 1.25 MG (50000 UT) PO CAPS: 50000.0000 [IU] | ORAL_CAPSULE | ORAL | 0 refills | Status: DC

## 2017-12-27 ENCOUNTER — Other Ambulatory Visit: Payer: Self-pay | Admitting: Internal Medicine

## 2017-12-27 DIAGNOSIS — Z1231 Encounter for screening mammogram for malignant neoplasm of breast: Secondary | ICD-10-CM

## 2018-01-14 ENCOUNTER — Ambulatory Visit
Admission: RE | Admit: 2018-01-14 | Discharge: 2018-01-14 | Disposition: A | Discharge status: Home / Self Care | Payer: 59 | Source: Ambulatory Visit | Attending: Internal Medicine | Admitting: Internal Medicine

## 2018-01-14 DIAGNOSIS — Z1231 Encounter for screening mammogram for malignant neoplasm of breast: Secondary | ICD-10-CM | POA: Insufficient documentation

## 2018-01-18 DIAGNOSIS — Z719 Counseling, unspecified: Secondary | ICD-10-CM | POA: Diagnosis not present

## 2018-02-02 DIAGNOSIS — Z719 Counseling, unspecified: Secondary | ICD-10-CM | POA: Diagnosis not present

## 2018-02-16 DIAGNOSIS — Z719 Counseling, unspecified: Secondary | ICD-10-CM | POA: Diagnosis not present

## 2018-02-23 DIAGNOSIS — Z719 Counseling, unspecified: Secondary | ICD-10-CM | POA: Diagnosis not present

## 2018-03-03 DIAGNOSIS — Z719 Counseling, unspecified: Secondary | ICD-10-CM | POA: Diagnosis not present

## 2018-03-09 DIAGNOSIS — Z719 Counseling, unspecified: Secondary | ICD-10-CM | POA: Diagnosis not present

## 2018-03-17 DIAGNOSIS — Z719 Counseling, unspecified: Secondary | ICD-10-CM | POA: Diagnosis not present

## 2018-03-19 ENCOUNTER — Other Ambulatory Visit: Payer: Self-pay | Admitting: Internal Medicine

## 2018-11-29 ENCOUNTER — Telehealth: Payer: Self-pay

## 2018-11-29 NOTE — Telephone Encounter (Signed)
Copied from [place] [id number]. Topic: Appointment Scheduling - Scheduling Inquiry for Clinic >> Nov 29, 2018  1:34 PM Richardo Priest, [place] wrote: No appointment has been scheduled. Patient is requesting a new patient appointment for her daughter, Natalie Barr DOB [id number]. Per scheduling protocol, this appointment requires a prior authorization prior to scheduling. Call back is [phone].   Route to department's PEC pool.

## 2018-12-05 ENCOUNTER — Encounter: Payer: 59 | Admitting: Internal Medicine

## 2019-01-20 ENCOUNTER — Other Ambulatory Visit: Payer: Self-pay

## 2019-01-23 ENCOUNTER — Encounter: Payer: Self-pay | Admitting: Internal Medicine

## 2019-01-23 ENCOUNTER — Ambulatory Visit (INDEPENDENT_AMBULATORY_CARE_PROVIDER_SITE_OTHER): Payer: 59 | Admitting: Internal Medicine

## 2019-01-23 ENCOUNTER — Other Ambulatory Visit: Payer: Self-pay

## 2019-01-23 VITALS — BP 128/74 | HR 81 | Temp 98.3°F | Resp 15 | Ht 63.5 in | Wt 195.2 lb

## 2019-01-23 DIAGNOSIS — D4989 Neoplasm of unspecified behavior of other specified sites: Secondary | ICD-10-CM | POA: Insufficient documentation

## 2019-01-23 DIAGNOSIS — E669 Obesity, unspecified: Secondary | ICD-10-CM | POA: Diagnosis not present

## 2019-01-23 DIAGNOSIS — R5383 Other fatigue: Secondary | ICD-10-CM

## 2019-01-23 DIAGNOSIS — Z1211 Encounter for screening for malignant neoplasm of colon: Secondary | ICD-10-CM

## 2019-01-23 DIAGNOSIS — Z0000 Encounter for general adult medical examination without abnormal findings: Secondary | ICD-10-CM

## 2019-01-23 DIAGNOSIS — Z1239 Encounter for other screening for malignant neoplasm of breast: Secondary | ICD-10-CM

## 2019-01-23 DIAGNOSIS — E6609 Other obesity due to excess calories: Secondary | ICD-10-CM

## 2019-01-23 DIAGNOSIS — Z6834 Body mass index (BMI) 34.0-34.9, adult: Secondary | ICD-10-CM

## 2019-01-23 LAB — CBC WITH DIFFERENTIAL/PLATELET
Basophils Absolute: 0 10*3/uL (ref 0.0–0.1)
Basophils Relative: 0.4 % (ref 0.0–3.0)
Eosinophils Absolute: 0.1 10*3/uL (ref 0.0–0.7)
Eosinophils Relative: 1.8 % (ref 0.0–5.0)
HCT: 36.9 % (ref 36.0–46.0)
Hemoglobin: 12.4 g/dL (ref 12.0–15.0)
Lymphocytes Relative: 21.7 % (ref 12.0–46.0)
Lymphs Abs: 1.6 10*3/uL (ref 0.7–4.0)
MCHC: 33.7 g/dL (ref 30.0–36.0)
MCV: 97.4 fl (ref 78.0–100.0)
Monocytes Absolute: 0.5 10*3/uL (ref 0.1–1.0)
Monocytes Relative: 7 % (ref 3.0–12.0)
Neutro Abs: 4.9 10*3/uL (ref 1.4–7.7)
Neutrophils Relative %: 69.1 % (ref 43.0–77.0)
Platelets: 334 10*3/uL (ref 150.0–400.0)
RBC: 3.78 Mil/uL — ABNORMAL LOW (ref 3.87–5.11)
RDW: 12.7 % (ref 11.5–15.5)
WBC: 7.1 10*3/uL (ref 4.0–10.5)

## 2019-01-23 LAB — COMPREHENSIVE METABOLIC PANEL
ALT: 17 U/L (ref 0–35)
AST: 16 U/L (ref 0–37)
Albumin: 4.5 g/dL (ref 3.5–5.2)
Alkaline Phosphatase: 72 U/L (ref 39–117)
BUN: 14 mg/dL (ref 6–23)
CO2: 27 mEq/L (ref 19–32)
Calcium: 9.5 mg/dL (ref 8.4–10.5)
Chloride: 100 mEq/L (ref 96–112)
Creatinine, Ser: 0.66 mg/dL (ref 0.40–1.20)
GFR: 93.34 mL/min (ref >60.00)
Glucose, Bld: 84 mg/dL (ref 70–99)
Potassium: 3.8 mEq/L (ref 3.5–5.1)
Sodium: 138 mEq/L (ref 135–145)
Total Bilirubin: 0.6 mg/dL (ref 0.2–1.2)
Total Protein: 7.1 g/dL (ref 6.0–8.3)

## 2019-01-23 LAB — LIPID PANEL
Cholesterol: 260 mg/dL — ABNORMAL HIGH (ref 0–200)
HDL: 55.3 mg/dL (ref >39.00)
LDL Cholesterol: 171 mg/dL — ABNORMAL HIGH (ref 0–99)
NonHDL: 204.59
Total CHOL/HDL Ratio: 5
Triglycerides: 167 mg/dL — ABNORMAL HIGH (ref 0.0–149.0)
VLDL: 33.4 mg/dL (ref 0.0–40.0)

## 2019-01-23 LAB — HEMOGLOBIN A1C: Hgb A1c MFr Bld: 5.3 % (ref 4.6–6.5)

## 2019-01-23 LAB — TSH: TSH: 1.32 u[IU]/mL (ref 0.35–4.50)

## 2019-01-23 NOTE — Assessment & Plan Note (Signed)

## 2019-01-23 NOTE — Assessment & Plan Note (Signed)
Hyperpigmented stellate shaped lesion Right axilla.  Patient has no knowledge of lesion so she is not sure if it has changed .  Recommended referral to dermatology for evaluation

## 2019-01-23 NOTE — Patient Instructions (Signed)
PB 2(powdered peanut butter)  And Natalie Barr (as well as [place] low carb whole wheat tortillas ) can all be found at [employer]  [employer] also carries Premier Protein shakes! Try the caramel flavor   Health Maintenance, Female Adopting a healthy lifestyle and getting preventive care are important in promoting health and wellness. Ask your health care provider about:  The right schedule for you to have regular tests and exams.  Things you can do on your own to prevent diseases and keep yourself healthy. What should I know about diet, weight, and exercise? Eat a healthy diet   Eat a diet that includes plenty of vegetables, fruits, low-fat dairy products, and lean protein.  Do not eat a lot of foods that are high in solid fats, added sugars, or sodium. Maintain a healthy weight Body mass index (BMI) is used to identify weight problems. It estimates body fat based on height and weight. Your health care provider can help determine your BMI and help you achieve or maintain a healthy weight. Get regular exercise Get regular exercise. This is one of the most important things you can do for your health. Most adults should:  Exercise for at least 150 minutes each week. The exercise should increase your heart rate and make you sweat (moderate-intensity exercise).  Do strengthening exercises at least twice a week. This is in addition to the moderate-intensity exercise.  Spend less time sitting. Even light physical activity can be beneficial. Watch cholesterol and blood lipids Have your blood tested for lipids and cholesterol at 54 years of age, then have this test every 5 years. Have your cholesterol levels checked more often if:  Your lipid or cholesterol levels are high.  You are older than 54 years of age.  You are at high risk for heart disease. What should I know about cancer screening? Depending on your health history and family history, you may need to have cancer screening at various ages.  This may include screening for:  Breast cancer.  Cervical cancer.  Colorectal cancer.  Skin cancer.  Lung cancer. What should I know about heart disease, diabetes, and high blood pressure? Blood pressure and heart disease  High blood pressure causes heart disease and increases the risk of stroke. This is more likely to develop in people who have high blood pressure readings, are of African descent, or are overweight.  Have your blood pressure checked: ? Every 3-5 years if you are 40-54 years of age. ? Every year if you are 54 years old or older. Diabetes Have regular diabetes screenings. This checks your fasting blood sugar level. Have the screening done:  Once every three years after age 54 if you are at a normal weight and have a low risk for diabetes.  More often and at a younger age if you are overweight or have a high risk for diabetes. What should I know about preventing infection? Hepatitis B If you have a higher risk for hepatitis B, you should be screened for this virus. Talk with your health care provider to find out if you are at risk for hepatitis B infection. Hepatitis C Testing is recommended for:  Everyone born from [birth year] through 1966.  Anyone with known risk factors for hepatitis C. Sexually transmitted infections (STIs)  Get screened for STIs, including gonorrhea and chlamydia, if: ? You are sexually active and are younger than 54 years of age. ? You are older than 54 years of age and your health care provider tells you that  you are at risk for this type of infection. ? Your sexual activity has changed since you were last screened, and you are at increased risk for chlamydia or gonorrhea. Ask your health care provider if you are at risk.  Ask your health care provider about whether you are at high risk for HIV. Your health care provider may recommend a prescription medicine to help prevent HIV infection. If you choose to take medicine to prevent HIV, you  should first get tested for HIV. You should then be tested every 3 months for as long as you are taking the medicine. Pregnancy  If you are about to stop having your period (premenopausal) and you may become pregnant, seek counseling before you get pregnant.  Take 400 to 800 micrograms (mcg) of folic acid every day if you become pregnant.  Ask for birth control (contraception) if you want to prevent pregnancy. Osteoporosis and menopause Osteoporosis is a disease in which the bones lose minerals and strength with aging. This can result in bone fractures. If you are 54 years old or older, or if you are at risk for osteoporosis and fractures, ask your health care provider if you should:  Be screened for bone loss.  Take a calcium or vitamin D supplement to lower your risk of fractures.  Be given hormone replacement therapy (HRT) to treat symptoms of menopause. Follow these instructions at home: Lifestyle  Do not use any products that contain nicotine or tobacco, such as cigarettes, e-cigarettes, and chewing tobacco. If you need help quitting, ask your health care provider.  Do not use street drugs.  Do not share needles.  Ask your health care provider for help if you need support or information about quitting drugs. Alcohol use  Do not drink alcohol if: ? Your health care provider tells you not to drink. ? You are pregnant, may be pregnant, or are planning to become pregnant.  If you drink alcohol: ? Limit how much you use to 0-1 drink a day. ? Limit intake if you are breastfeeding.  Be aware of how much alcohol is in your drink. In the [place] one drink equals one 12 oz bottle of beer (355 mL), one 5 oz glass of wine (148 mL), or one 1 oz glass of hard liquor (44 mL). General instructions  Schedule regular health, dental, and eye exams.  Stay current with your vaccines.  Tell your health care provider if: ? You often feel depressed. ? You have ever been abused or do not feel  safe at home. Summary  Adopting a healthy lifestyle and getting preventive care are important in promoting health and wellness.  Follow your health care provider's instructions about healthy diet, exercising, and getting tested or screened for diseases.  Follow your health care provider's instructions on monitoring your cholesterol and blood pressure. This information is not intended to replace advice given to you by your health care provider. Make sure you discuss any questions you have with your health care provider. Document Released: 12/15/2010 Document Revised: 05/25/2018 Document Reviewed: 05/25/2018 Elsevier Patient Education  2020 [employer].

## 2019-01-23 NOTE — Progress Notes (Signed)
Patient ID: Natalie Barr, female    DOB: September 02, 1965  Age: 53 y.o. MRN: [id number]  The patient is here for annual preventive  examination and management of other chronic and acute problems.    Due for colonoscopy The risk factors are reflected in the social history.  The roster of all physicians providing medical care to patient - is listed in the Snapshot section of the chart.  Activities of daily living:  The patient is 100% independent in all ADLs: dressing, toileting, feeding as well as independent mobility  Home safety : The patient has smoke detectors in the home. They wear seatbelts.  There are no firearms at home. There is no violence in the home.   There is no risks for hepatitis, STDs or HIV. There is no   history of blood transfusion. They have no travel history to infectious disease endemic areas of the world.  The patient has seen their dentist in the last six month. They have seen their eye doctor in the last year. They admit to slight hearing difficulty with regard to whispered voices and some television programs.  They have deferred audiologic testing in the last year.  They do not  have excessive sun exposure. Discussed the need for sun protection: hats, long sleeves and use of sunscreen if there is significant sun exposure.   Diet: the importance of a healthy diet is discussed. They do have a healthy diet.  The benefits of regular aerobic exercise were discussed. She has stopped walking regularly due to a corn on the bottom of her foot.     Depression screen: there are no signs or vegative symptoms of depression- irritability, change in appetite, anhedonia, sadness/tearfullness.  Cognitive assessment: the patient manages all their financial and personal affairs and is actively engaged. They could relate day,date,year and events; recalled 2/3 objects at 3 minutes; performed clock-face test normally.  The following portions of the patient's history were reviewed and updated  as appropriate: allergies, current medications, past family history, past medical history,  past surgical history, past social history  and problem list.  Visual acuity was not assessed per patient preference since she has regular follow up with her ophthalmologist. Hearing and body mass index were assessed and reviewed.   During the course of the visit the patient was educated and counseled about appropriate screening and preventive services including : fall prevention , diabetes screening, nutrition counseling, colorectal cancer screening, and recommended immunizations.    CC: The primary encounter diagnosis was Breast cancer screening. Diagnoses of Fatigue, unspecified type, Obesity (BMI 30.0-34.9), Neoplasm of axilla, Class 1 obesity due to excess calories without serious comorbidity with body mass index (BMI) of 34.0 to 34.9 in adult, and Encounter for preventive health examination were also pertinent to this visit.  History Natalie Barr has a past medical history of Chicken pox (as a child).   She has a past surgical history that includes Cesarean section (1994) and Wisdom tooth extraction (1985).   Her family history includes Cancer in her father; Dementia in her maternal grandmother; Heart attack in her maternal grandfather and paternal grandfather.She reports that she has never smoked. She has never used smokeless tobacco. She reports that she does not drink alcohol or use drugs.  Outpatient Medications Prior to Visit  Medication Sig Dispense Refill  . Vitamin D, Ergocalciferol, (DRISDOL) 50000 units CAPS capsule TAKE ONE CAPSULE BY MOUTH ONE TIME PER WEEK (Patient not taking: Reported on 01/23/2019) 12 capsule 0   No facility-administered medications prior  to visit.     Review of Systems   Patient denies headache, fevers, malaise, unintentional weight loss, skin rash, eye pain, sinus congestion and sinus pain, sore throat, dysphagia,  hemoptysis , cough, dyspnea, wheezing, chest pain,  palpitations, orthopnea, edema, abdominal pain, nausea, melena, diarrhea, constipation, flank pain, dysuria, hematuria, urinary  Frequency, nocturia, numbness, tingling, seizures,  Focal weakness, Loss of consciousness,  Tremor, insomnia, depression, anxiety, and suicidal ideation.      Objective:  BP 128/74 (BP Location: Left Arm, Patient Position: Sitting, Cuff Size: Large)   Pulse 81   Temp 98.3 F (36.8 C) (Oral)   Resp 15   Ht 5' 3.5" (1.613 m)   Wt 195 lb 3.2 oz (88.5 kg)   LMP 11/26/2016   SpO2 97%   BMI 34.04 kg/m   Physical Exam   General appearance: alert, cooperative and appears stated age Head: Normocephalic, without obvious abnormality, atraumatic Eyes: conjunctivae/corneas clear. PERRL, EOM's intact. Fundi benign. Ears: normal TM's and external ear canals both ears Nose: Nares normal. Septum midline. Mucosa normal. No drainage or sinus tenderness. Throat: lips, mucosa, and tongue normal; teeth and gums normal Neck: no adenopathy, no carotid bruit, no JVD, supple, symmetrical, trachea midline and thyroid not enlarged, symmetric, no tenderness/mass/nodules Lungs: clear to auscultation bilaterally Breasts: normal appearance, no masses or tenderness Heart: regular rate and rhythm, S1, S2 normal, no murmur, click, rub or gallop Abdomen: soft, non-tender; bowel sounds normal; no masses,  no organomegaly Extremities: extremities normal, atraumatic, no cyanosis or edema Pulses: 2+ and symmetric Skin: Skin color, texture, turgor normal. No rashes or lesions Neurologic: Alert and oriented X 3, normal strength and tone. Normal symmetric reflexes. Normal coordination and gait.      Assessment & Plan:   Problem List Items Addressed This Visit      Unprioritized   Encounter for preventive health examination (Chronic)    age appropriate education and counseling updated, referrals for preventative services and immunizations addressed, dietary and smoking counseling addressed,  most recent labs reviewed.  I have personally reviewed and have noted:  1) the patient's medical and social history 2) The pt's use of alcohol, tobacco, and illicit drugs 3) The patient's current medications and supplements 4) Functional ability including ADL's, fall risk, home safety risk, hearing and visual impairment 5) Diet and physical activities 6) Evidence for depression or mood disorder 7) The patient's height, weight, and BMI have been recorded in the chart  I have made referrals, and provided counseling and education based on review of the above      Obesity    I have addressed  BMI and recommended a low glycemic index diet utilizing smaller more frequent meals to increase metabolism.  I have also recommended that patient start exercising with a goal of 30 minutes of aerobic exercise a minimum of 5 days per week. Screening for lipid disorders, thyroid and diabetes to be done today.        Neoplasm of axilla    Hyperpigmented stellate shaped lesion Right axilla.  Patient has no knowledge of lesion so she is not sure if it has changed .  Recommended referral to dermatology for evaluation        Other Visit Diagnoses    Breast cancer screening    -  Primary   Relevant Orders   MM 3D SCREEN BREAST BILATERAL   Fatigue, unspecified type       Relevant Orders   TSH (Completed)   CBC with Differential/Platelet (Completed)  Obesity (BMI 30.0-34.9)       Relevant Orders   Comprehensive metabolic panel (Completed)   Hemoglobin A1c (Completed)   Lipid panel (Completed)      I have discontinued Kylii Padula's Vitamin D (Ergocalciferol).  No orders of the defined types were placed in this encounter.   Medications Discontinued During This Encounter  Medication Reason  . Vitamin D, Ergocalciferol, (DRISDOL) 50000 units CAPS capsule Completed Course    Follow-up: No follow-ups on file.   Crecencio Mc, MD

## 2019-01-23 NOTE — Assessment & Plan Note (Signed)
I have addressed  BMI and recommended a low glycemic index diet utilizing smaller more frequent meals to increase metabolism.  I have also recommended that patient start exercising with a goal of 30 minutes of aerobic exercise a minimum of 5 days per week. Screening for lipid disorders, thyroid and diabetes to be done today.   

## 2019-02-06 ENCOUNTER — Encounter: Payer: Self-pay | Admitting: *Deleted

## 2019-02-06 ENCOUNTER — Encounter: Payer: Self-pay | Admitting: Internal Medicine

## 2019-02-17 ENCOUNTER — Telehealth: Payer: Self-pay

## 2019-02-17 ENCOUNTER — Other Ambulatory Visit: Payer: Self-pay

## 2019-02-17 DIAGNOSIS — Z1211 Encounter for screening for malignant neoplasm of colon: Secondary | ICD-10-CM

## 2019-02-17 MED ORDER — NA SULFATE-K SULFATE-MG SULF 17.5-3.13-1.6 GM/177ML PO SOLN: 1.0000 | Freq: Once | ORAL | 0 refills | Status: AC

## 2019-02-17 NOTE — Telephone Encounter (Signed)
Gastroenterology Pre-Procedure Review  Request Date: 03/06/19 Requesting Physician: Dr. Marius Ditch  PATIENT REVIEW QUESTIONS: The patient responded to the following health history questions as indicated:    1. Are you having any GI issues? no 2. Do you have a personal history of Polyps? no 3. Do you have a family history of Colon Cancer or Polyps? no 4. Diabetes Mellitus? no 5. Joint replacements in the past 12 months?no 6. Major health problems in the past 3 months?no 7. Any artificial heart valves, MVP, or defibrillator?no    MEDICATIONS & ALLERGIES:    Patient reports the following regarding taking any anticoagulation/antiplatelet therapy:   Plavix, Coumadin, Eliquis, Xarelto, Lovenox, Pradaxa, Brilinta, or Effient? no Aspirin? no  Patient confirms/reports the following medications:  No current outpatient medications on file.   No current facility-administered medications for this visit.     Patient confirms/reports the following allergies:  No Known Allergies  No orders of the defined types were placed in this encounter.   AUTHORIZATION INFORMATION Primary Insurance: 1D#: Group #:  Secondary Insurance: 1D#: Group #:  SCHEDULE INFORMATION: Date: 03/06/19 Time: Location:[place]

## 2019-03-02 ENCOUNTER — Other Ambulatory Visit
Admission: RE | Admit: 2019-03-02 | Discharge: 2019-03-02 | Disposition: A | Discharge status: Home / Self Care | Payer: 59 | Source: Ambulatory Visit | Attending: Gastroenterology | Admitting: Gastroenterology

## 2019-03-02 ENCOUNTER — Other Ambulatory Visit: Payer: Self-pay

## 2019-03-02 DIAGNOSIS — Z01812 Encounter for preprocedural laboratory examination: Secondary | ICD-10-CM | POA: Diagnosis not present

## 2019-03-02 DIAGNOSIS — Z20828 Contact with and (suspected) exposure to other viral communicable diseases: Secondary | ICD-10-CM | POA: Insufficient documentation

## 2019-03-03 LAB — SARS CORONAVIRUS 2 (TAT 6-24 HRS): SARS Coronavirus 2: NEGATIVE

## 2019-03-05 ENCOUNTER — Encounter: Payer: Self-pay | Admitting: Anesthesiology

## 2019-03-06 ENCOUNTER — Other Ambulatory Visit: Payer: Self-pay

## 2019-03-06 ENCOUNTER — Ambulatory Visit: Payer: 59 | Admitting: Anesthesiology

## 2019-03-06 ENCOUNTER — Ambulatory Visit
Admission: RE | Admit: 2019-03-06 | Discharge: 2019-03-06 | Disposition: A | Discharge status: Home / Self Care | Payer: 59 | Attending: Gastroenterology | Admitting: Gastroenterology

## 2019-03-06 ENCOUNTER — Encounter
Admission: RE | Disposition: A | Discharge status: Home / Self Care | Payer: Self-pay | Source: Home / Self Care | Attending: Gastroenterology

## 2019-03-06 ENCOUNTER — Encounter: Payer: Self-pay | Admitting: *Deleted

## 2019-03-06 DIAGNOSIS — D125 Benign neoplasm of sigmoid colon: Secondary | ICD-10-CM | POA: Diagnosis not present

## 2019-03-06 DIAGNOSIS — D120 Benign neoplasm of cecum: Secondary | ICD-10-CM | POA: Insufficient documentation

## 2019-03-06 DIAGNOSIS — K635 Polyp of colon: Secondary | ICD-10-CM | POA: Diagnosis not present

## 2019-03-06 DIAGNOSIS — Z1211 Encounter for screening for malignant neoplasm of colon: Secondary | ICD-10-CM | POA: Diagnosis not present

## 2019-03-06 HISTORY — PX: COLONOSCOPY WITH PROPOFOL: SHX5780

## 2019-03-06 SURGERY — COLONOSCOPY WITH PROPOFOL
Anesthesia: General

## 2019-03-06 MED ORDER — PROPOFOL 500 MG/50ML IV EMUL
INTRAVENOUS | Status: DC | PRN
  Administered 2019-03-06: 150 ug/kg/min via INTRAVENOUS

## 2019-03-06 MED ORDER — SODIUM CHLORIDE 0.9 % IV SOLN
INTRAVENOUS | Status: DC
  Administered 2019-03-06: 10:00:00 via INTRAVENOUS

## 2019-03-06 MED ORDER — PROPOFOL 10 MG/ML IV BOLUS
INTRAVENOUS | Status: AC
  Filled 2019-03-06: qty 40

## 2019-03-06 MED ORDER — PROPOFOL 10 MG/ML IV BOLUS
INTRAVENOUS | Status: DC | PRN
  Administered 2019-03-06: 50 mg via INTRAVENOUS

## 2019-03-06 NOTE — Transfer of Care (Signed)
Immediate Anesthesia Transfer of Care Note  Patient: Natalie Barr  Procedure(s) Performed: COLONOSCOPY WITH PROPOFOL (N/A )  Patient Location: Endoscopy Unit  Anesthesia Type:General  Level of Consciousness: awake, alert  and oriented  Airway & Oxygen Therapy: Patient Spontanous Breathing and Patient connected to nasal cannula oxygen  Post-op Assessment: Report given to RN and Post -op Vital signs reviewed and stable  Post vital signs: Reviewed and stable  Last Vitals:  Vitals Value Taken Time  BP    Temp    Pulse    Resp    SpO2      Last Pain:  Vitals:   03/06/19 1002  TempSrc: Tympanic  PainSc: 0-No pain         Complications: No apparent anesthesia complications

## 2019-03-06 NOTE — Anesthesia Postprocedure Evaluation (Signed)
Anesthesia Post Note  Patient: Natalie Barr  Procedure(s) Performed: COLONOSCOPY WITH PROPOFOL (N/A )  Patient location during evaluation: Endoscopy Anesthesia Type: General Level of consciousness: awake and alert Pain management: pain level controlled Vital Signs Assessment: post-procedure vital signs reviewed and stable Respiratory status: spontaneous breathing, nonlabored ventilation, respiratory function stable and patient connected to nasal cannula oxygen Cardiovascular status: blood pressure returned to baseline and stable Postop Assessment: no apparent nausea or vomiting Anesthetic complications: no     Last Vitals:  Vitals:   03/06/19 1207 03/06/19 1217  BP: 110/66 (!) 128/94  Pulse: 66 73  Resp: 15 16  Temp:    SpO2: 98% 99%    Last Pain:  Vitals:   03/06/19 1217  TempSrc:   PainSc: 0-No pain                 Amsi Grimley S

## 2019-03-06 NOTE — Op Note (Signed)
[facility] Patient Name: Delcie Ruppert Procedure Date: 03/06/2019 11:30 AM MRN: [id number] Account #: [id number] Date of Birth: 1965/12/17 Admit Type: Outpatient Age: 53 Room: [facility] ENDO ROOM 2 Gender: Female Note Status: Finalized Procedure:            Colonoscopy Indications:          Screening for colorectal malignant neoplasm, This is                        the patient's first colonoscopy Providers:            Lin Landsman MD, MD Referring MD:         Deborra Medina, MD (Referring MD) Medicines:            Monitored Anesthesia Care Complications:        No immediate complications. Estimated blood loss: None. Procedure:            Pre-Anesthesia Assessment:                       - Prior to the procedure, a History and Physical was                        performed, and patient medications and allergies were                        reviewed. The patient is competent. The risks and                        benefits of the procedure and the sedation options and                        risks were discussed with the patient. All questions                        were answered and informed consent was obtained.                        Patient identification and proposed procedure were                        verified by the physician, the nurse, the                        anesthesiologist, the anesthetist and the technician in                        the pre-procedure area in the procedure room in the                        endoscopy suite. Mental Status Examination: alert and                        oriented. Airway Examination: normal oropharyngeal                        airway and neck mobility. Respiratory Examination:                        clear to auscultation. CV Examination: normal.  Prophylactic Antibiotics: The patient does not require                        prophylactic antibiotics. Prior Anticoagulants: The                         patient has taken no previous anticoagulant or                        antiplatelet agents. ASA Grade Assessment: II - A                        patient with mild systemic disease. After reviewing the                        risks and benefits, the patient was deemed in                        satisfactory condition to undergo the procedure. The                        anesthesia plan was to use monitored anesthesia care                        (MAC). Immediately prior to administration of                        medications, the patient was re-assessed for adequacy                        to receive sedatives. The heart rate, respiratory rate,                        oxygen saturations, blood pressure, adequacy of                        pulmonary ventilation, and response to care were                        monitored throughout the procedure. The physical status                        of the patient was re-assessed after the procedure.                       After obtaining informed consent, the colonoscope was                        passed under direct vision. Throughout the procedure,                        the patient's blood pressure, pulse, and oxygen                        saturations were monitored continuously. The                        Colonoscope was introduced through the anus and  advanced to the the cecum, identified by appendiceal                        orifice and ileocecal valve. The colonoscopy was                        performed without difficulty. The patient tolerated the                        procedure well. The quality of the bowel preparation                        was evaluated using the BBPS [facility] Bowel Preparation                        Scale) with scores of: Right Colon = 3, Transverse                        Colon = 3 and Left Colon = 3 (entire mucosa seen well                        with no residual staining, small fragments of stool or                         opaque liquid). The total BBPS score equals 9. Findings:      The perianal and digital rectal examinations were normal. Pertinent       negatives include normal sphincter tone and no palpable rectal lesions.      Two sessile polyps were found in the sigmoid colon and cecum. The polyps       were 5 mm in size. These polyps were removed with a cold snare.       Resection and retrieval were complete.      The retroflexed view of the distal rectum and anal verge was normal and       showed no anal or rectal abnormalities. Impression:           - Two 5 mm polyps in the sigmoid colon and in the                        cecum, removed with a cold snare. Resected and                        retrieved.                       - The distal rectum and anal verge are normal on                        retroflexion view. Recommendation:       - Discharge patient to home (with escort).                       - Resume regular diet today.                       - Continue present medications.                       - Await pathology results.                       -  Repeat colonoscopy in 5 years for surveillance. Procedure Code(s):    --- Professional ---                       [phone], Colonoscopy, flexible; with removal of tumor(s),                        polyp(s), or other lesion(s) by snare technique Diagnosis Code(s):    --- Professional ---                       Z12.11, Encounter for screening for malignant neoplasm                        of colon                       K63.5, Polyp of colon CPT copyright 2019 American Medical Association. All rights reserved. The codes documented in this report are preliminary and upon coder review may  be revised to meet current compliance requirements. Dr. Ulyess Mort Lin Landsman MD, MD 03/06/2019 11:54:26 AM This report has been signed electronically. Number of Addenda: 0 Note Initiated On: 03/06/2019 11:30 AM Scope Withdrawal Time: 0  hours 9 minutes 51 seconds  Total Procedure Duration: 0 hours 14 minutes 14 seconds  Estimated Blood Loss: Estimated blood loss: none.      [facility]

## 2019-03-06 NOTE — H&P (Signed)
Natalie Darby, MD [address]  [place]  [place], [place] [address]  Main: [phone]  Fax: [phone] Pager: [phone]  Primary Care Physician:  Crecencio Mc, MD Primary Gastroenterologist:  Dr. Cephas Barr  Pre-Procedure History & Physical: HPI:  Natalie Barr is a 53 y.o. female is here for an colonoscopy.   Past Medical History:  Diagnosis Date  . Chicken pox as a child    Past Surgical History:  Procedure Laterality Date  . CESAREAN SECTION  1994  . [place]    Prior to Admission medications   Not on File    Allergies as of 02/17/2019  . (No Known Allergies)    Family History  Problem Relation Age of Onset  . Cancer Father        prostate  . Dementia Maternal Grandmother   . Heart attack Maternal Grandfather   . Heart attack Paternal Grandfather     Social History   Socioeconomic History  . Marital status: Married    Spouse name: Not on file  . Number of children: Not on file  . Years of education: Not on file  . Highest education level: Not on file  Occupational History  . Not on file  Social Needs  . Financial resource strain: Not on file  . Food insecurity    Worry: Not on file    Inability: Not on file  . Transportation needs    Medical: Not on file    Non-medical: Not on file  Tobacco Use  . Smoking status: Never Smoker  . Smokeless tobacco: Never Used  Substance and Sexual Activity  . Alcohol use: No  . Drug use: No  . Sexual activity: Yes    Partners: Male  Lifestyle  . Physical activity    Days per week: Not on file    Minutes per session: Not on file  . Stress: Not on file  Relationships  . Social Herbalist on phone: Not on file    Gets together: Not on file    Attends religious service: Not on file    Active member of club or organization: Not on file    Attends meetings of clubs or organizations: Not on file    Relationship status: Not on file  . Intimate partner  violence    Fear of current or ex partner: Not on file    Emotionally abused: Not on file    Physically abused: Not on file    Forced sexual activity: Not on file  Other Topics Concern  . Not on file  Social History Narrative  . Not on file    Review of Systems: See HPI, otherwise negative ROS  Physical Exam: BP 137/89   Pulse 82   Temp (!) 97.3 F (36.3 C) (Tympanic)   Resp 16   Ht 5\' 4"  (1.626 m)   Wt 85.3 kg   LMP 11/26/2016   SpO2 100%   BMI 32.27 kg/m  General:   Alert,  pleasant and cooperative in NAD Head:  Normocephalic and atraumatic. Neck:  Supple; no masses or thyromegaly. Lungs:  Clear throughout to auscultation.    Heart:  Regular rate and rhythm. Abdomen:  Soft, nontender and nondistended. Normal bowel sounds, without guarding, and without rebound.   Neurologic:  Alert and  oriented x4;  grossly normal neurologically.  Impression/Plan: Valda Hagin is here for an colonoscopy to be performed for colon cancer screening  Risks,  benefits, limitations, and alternatives regarding  colonoscopy have been reviewed with the patient.  Questions have been answered.  All parties agreeable.   Sherri Sear, MD  03/06/2019, 11:29 AM

## 2019-03-06 NOTE — Anesthesia Preprocedure Evaluation (Signed)
Anesthesia Evaluation  Patient identified by MRN, date of birth, ID band Patient awake    Reviewed: Allergy & Precautions, NPO status , Patient's Chart, lab work & pertinent test results, reviewed documented beta blocker date and time   Airway Mallampati: II  TM Distance: >3 FB     Dental  (+) Chipped   Pulmonary           Cardiovascular      Neuro/Psych    GI/Hepatic   Endo/Other    Renal/GU      Musculoskeletal   Abdominal   Peds  Hematology   Anesthesia Other Findings Pt states she is not pregnant.  Reproductive/Obstetrics                             Anesthesia Physical Anesthesia Plan  ASA: II  Anesthesia Plan: General   Post-op Pain Management:    Induction: Intravenous  PONV Risk Score and Plan:   Airway Management Planned:   Additional Equipment:   Intra-op Plan:   Post-operative Plan:   Informed Consent: I have reviewed the patients History and Physical, chart, labs and discussed the procedure including the risks, benefits and alternatives for the proposed anesthesia with the patient or authorized representative who has indicated his/her understanding and acceptance.       Plan Discussed with: CRNA  Anesthesia Plan Comments:         Anesthesia Quick Evaluation

## 2019-03-06 NOTE — Anesthesia Post-op Follow-up Note (Signed)
Anesthesia QCDR form completed.        

## 2019-03-07 ENCOUNTER — Encounter: Payer: Self-pay | Admitting: Gastroenterology

## 2019-03-07 LAB — SURGICAL PATHOLOGY

## 2019-03-08 ENCOUNTER — Encounter: Payer: Self-pay | Admitting: Gastroenterology

## 2019-03-31 ENCOUNTER — Ambulatory Visit
Admission: RE | Admit: 2019-03-31 | Discharge: 2019-03-31 | Disposition: A | Discharge status: Home / Self Care | Payer: 59 | Source: Ambulatory Visit | Attending: Internal Medicine | Admitting: Internal Medicine

## 2019-03-31 ENCOUNTER — Other Ambulatory Visit: Payer: Self-pay

## 2019-03-31 DIAGNOSIS — Z1231 Encounter for screening mammogram for malignant neoplasm of breast: Secondary | ICD-10-CM | POA: Insufficient documentation

## 2019-03-31 DIAGNOSIS — Z1239 Encounter for other screening for malignant neoplasm of breast: Secondary | ICD-10-CM

## 2019-08-07 ENCOUNTER — Ambulatory Visit: Payer: 59 | Attending: Internal Medicine

## 2019-08-07 DIAGNOSIS — Z23 Encounter for immunization: Secondary | ICD-10-CM

## 2019-08-07 NOTE — Progress Notes (Signed)
   Covid-19 Vaccination Clinic  Name:  Natalie Barr    MRN: [id number] DOB: 27-Jan-1966  08/07/2019  [name] was observed post Covid-19 immunization for 15 minutes without incidence. She was provided with Vaccine Information Sheet and instruction to access the V-Safe system.   [name] was instructed to call 911 with any severe reactions post vaccine: Marland Kitchen Difficulty breathing  . Swelling of your face and throat  . A fast heartbeat  . A bad rash all over your body  . Dizziness and weakness    Immunizations Administered    Name Date Dose VIS Date Route   Moderna COVID-19 Vaccine 08/07/2019  1:40 PM 0.5 mL 05/16/2019 Intramuscular   Manufacturer: Moderna   LotUZ:[id number]   [id number]

## 2019-09-05 ENCOUNTER — Ambulatory Visit: Payer: 59 | Attending: Internal Medicine

## 2019-09-05 DIAGNOSIS — Z23 Encounter for immunization: Secondary | ICD-10-CM

## 2019-09-05 NOTE — Progress Notes (Signed)
   Covid-19 Vaccination Clinic  Name:  Karmon Folk    MRN: [id number] DOB: 01/15/1966  09/05/2019  Ms. Ytuarte was observed post Covid-19 immunization for 15 minutes without incident. She was provided with Vaccine Information Sheet and instruction to access the V-Safe system.   Ms. Wi was instructed to call 911 with any severe reactions post vaccine: Marland Kitchen Difficulty breathing  . Swelling of face and throat  . A fast heartbeat  . A bad rash all over body  . Dizziness and weakness   Immunizations Administered    Name Date Dose VIS Date Route   Moderna COVID-19 Vaccine 09/05/2019 10:26 AM 0.5 mL 05/16/2019 Intramuscular   Manufacturer: Levan Hurst   LotUT:[id number]   [id number]

## 2020-01-29 ENCOUNTER — Encounter: Payer: 59 | Admitting: Internal Medicine

## 2020-04-26 ENCOUNTER — Other Ambulatory Visit: Payer: Self-pay

## 2020-04-30 ENCOUNTER — Encounter: Payer: Self-pay | Admitting: Internal Medicine

## 2020-04-30 ENCOUNTER — Ambulatory Visit (INDEPENDENT_AMBULATORY_CARE_PROVIDER_SITE_OTHER): Payer: 59 | Admitting: Internal Medicine

## 2020-04-30 ENCOUNTER — Other Ambulatory Visit (HOSPITAL_COMMUNITY)
Admission: RE | Admit: 2020-04-30 | Discharge: 2020-04-30 | Disposition: A | Discharge status: Home / Self Care | Payer: 59 | Source: Ambulatory Visit | Attending: Internal Medicine | Admitting: Internal Medicine

## 2020-04-30 ENCOUNTER — Other Ambulatory Visit: Payer: Self-pay

## 2020-04-30 VITALS — BP 124/86 | HR 75 | Temp 98.2°F | Resp 13 | Ht 64.0 in | Wt 200.6 lb

## 2020-04-30 DIAGNOSIS — Z1231 Encounter for screening mammogram for malignant neoplasm of breast: Secondary | ICD-10-CM

## 2020-04-30 DIAGNOSIS — Z6834 Body mass index (BMI) 34.0-34.9, adult: Secondary | ICD-10-CM

## 2020-04-30 DIAGNOSIS — Z124 Encounter for screening for malignant neoplasm of cervix: Secondary | ICD-10-CM

## 2020-04-30 DIAGNOSIS — E6609 Other obesity due to excess calories: Secondary | ICD-10-CM

## 2020-04-30 DIAGNOSIS — Z0000 Encounter for general adult medical examination without abnormal findings: Secondary | ICD-10-CM | POA: Diagnosis not present

## 2020-04-30 DIAGNOSIS — Z23 Encounter for immunization: Secondary | ICD-10-CM | POA: Diagnosis not present

## 2020-04-30 DIAGNOSIS — E785 Hyperlipidemia, unspecified: Secondary | ICD-10-CM

## 2020-04-30 DIAGNOSIS — R635 Abnormal weight gain: Secondary | ICD-10-CM | POA: Diagnosis not present

## 2020-04-30 MED ORDER — ZOSTER VAC RECOMB ADJUVANTED 50 MCG/0.5ML IM SUSR: 0.5000 mL | Freq: Once | INTRAMUSCULAR | 1 refills | Status: AC

## 2020-04-30 NOTE — Patient Instructions (Signed)
Your annual mammogram has been ordered.  You are encouraged (required) to call to make your appointment at [facility]    The ShingRx vaccine is now available in local pharmacies and is much more protective than the old one  Zostavax  (it is about 97%  Effective in preventing shingles). .   It is therefore ADVISED for all interested adults over 50 to prevent shingles so I have printed you a prescription for it.  (it requires a 2nd dose 2 too 6 months after the first one) .  It will cause you to have flu  like symptoms for 2 days    Health Maintenance for Postmenopausal Women Menopause is a normal process in which your ability to get pregnant comes to an end. This process happens slowly over many months or years, usually between the ages of 67 and 40. Menopause is complete when you have missed your menstrual periods for 12 months. It is important to talk with your health care provider about some of the most common conditions that affect women after menopause (postmenopausal women). These include heart disease, cancer, and bone loss (osteoporosis). Adopting a healthy lifestyle and getting preventive care can help to promote your health and wellness. The actions you take can also lower your chances of developing some of these common conditions. What should I know about menopause? During menopause, you may get a number of symptoms, such as:  Hot flashes. These can be moderate or severe.  Night sweats.  Decrease in sex drive.  Mood swings.  Headaches.  Tiredness.  Irritability.  Memory problems.  Insomnia. Choosing to treat or not to treat these symptoms is a decision that you make with your health care provider. Do I need hormone replacement therapy?  Hormone replacement therapy is effective in treating symptoms that are caused by menopause, such as hot flashes and night sweats.  Hormone replacement carries certain risks, especially as you become older. If you are thinking  about using estrogen or estrogen with progestin, discuss the benefits and risks with your health care provider. What is my risk for heart disease and stroke? The risk of heart disease, heart attack, and stroke increases as you age. One of the causes may be a change in the body's hormones during menopause. This can affect how your body uses dietary fats, triglycerides, and cholesterol. Heart attack and stroke are medical emergencies. There are many things that you can do to help prevent heart disease and stroke. Watch your blood pressure  High blood pressure causes heart disease and increases the risk of stroke. This is more likely to develop in people who have high blood pressure readings, are of African descent, or are overweight.  Have your blood pressure checked: ? Every 3-5 years if you are 49-74 years of age. ? Every year if you are 51 years old or older. Eat a healthy diet   Eat a diet that includes plenty of vegetables, fruits, low-fat dairy products, and lean protein.  Do not eat a lot of foods that are high in solid fats, added sugars, or sodium. Get regular exercise Get regular exercise. This is one of the most important things you can do for your health. Most adults should:  Try to exercise for at least 150 minutes each week. The exercise should increase your heart rate and make you sweat (moderate-intensity exercise).  Try to do strengthening exercises at least twice each week. Do these in addition to the moderate-intensity exercise.  Spend less  time sitting. Even light physical activity can be beneficial. Other tips  Work with your health care provider to achieve or maintain a healthy weight.  Do not use any products that contain nicotine or tobacco, such as cigarettes, e-cigarettes, and chewing tobacco. If you need help quitting, ask your health care provider.  Know your numbers. Ask your health care provider to check your cholesterol and your blood sugar (glucose).  Continue to have your blood tested as directed by your health care provider. Do I need screening for cancer? Depending on your health history and family history, you may need to have cancer screening at different stages of your life. This may include screening for:  Breast cancer.  Cervical cancer.  Lung cancer.  Colorectal cancer. What is my risk for osteoporosis? After menopause, you may be at increased risk for osteoporosis. Osteoporosis is a condition in which bone destruction happens more quickly than new bone creation. To help prevent osteoporosis or the bone fractures that can happen because of osteoporosis, you may take the following actions:  If you are 75-75 years old, get at least 1,000 mg of calcium and at least 600 mg of vitamin D per day.  If you are older than age 55 but younger than age 49, get at least 1,200 mg of calcium and at least 600 mg of vitamin D per day.  If you are older than age 22, get at least 1,200 mg of calcium and at least 800 mg of vitamin D per day. Smoking and drinking excessive alcohol increase the risk of osteoporosis. Eat foods that are rich in calcium and vitamin D, and do weight-bearing exercises several times each week as directed by your health care provider. How does menopause affect my mental health? Depression may occur at any age, but it is more common as you become older. Common symptoms of depression include:  Low or sad mood.  Changes in sleep patterns.  Changes in appetite or eating patterns.  Feeling an overall lack of motivation or enjoyment of activities that you previously enjoyed.  Frequent crying spells. Talk with your health care provider if you think that you are experiencing depression. General instructions See your health care provider for regular wellness exams and vaccines. This may include:  Scheduling regular health, dental, and eye exams.  Getting and maintaining your vaccines. These include: ? Influenza vaccine.  Get this vaccine each year before the flu season begins. ? Pneumonia vaccine. ? Shingles vaccine. ? Tetanus, diphtheria, and pertussis (Tdap) booster vaccine. Your health care provider may also recommend other immunizations. Tell your health care provider if you have ever been abused or do not feel safe at home. Summary  Menopause is a normal process in which your ability to get pregnant comes to an end.  This condition causes hot flashes, night sweats, decreased interest in sex, mood swings, headaches, or lack of sleep.  Treatment for this condition may include hormone replacement therapy.  Take actions to keep yourself healthy, including exercising regularly, eating a healthy diet, watching your weight, and checking your blood pressure and blood sugar levels.  Get screened for cancer and depression. Make sure that you are up to date with all your vaccines. This information is not intended to replace advice given to you by your health care provider. Make sure you discuss any questions you have with your health care provider. Document Revised: 05/25/2018 Document Reviewed: 05/25/2018 Elsevier Patient Education  2020 [employer].

## 2020-04-30 NOTE — Assessment & Plan Note (Addendum)
10 year risk of CAD using last year's  FRC was 4.3%   Repeat labs are pending   Lab Results  Component Value Date   CHOL 260 (H) 01/23/2019   HDL 55.30 01/23/2019   LDLCALC 171 (H) 01/23/2019   LDLDIRECT 139.0 11/29/2017   TRIG 167.0 (H) 01/23/2019   CHOLHDL 5 01/23/2019

## 2020-04-30 NOTE — Assessment & Plan Note (Signed)

## 2020-04-30 NOTE — Assessment & Plan Note (Signed)
I have congratulated her in  Her temporary reduction of   BMI and encouraged  Continued weight loss with goal of 10% of body weigh over the next 6 months using a low glycemic index diet and regular exercise a minimum of 5 days per week.

## 2020-04-30 NOTE — Progress Notes (Signed)
Patient ID: Natalie Barr, female    DOB: March 28, 1966  Age: 54 y.o. MRN: [id number]  The patient is here for annual  wellness examination.   This visit occurred during the SARS-CoV-2 public health emergency.  Safety protocols were in place, including screening questions prior to the visit, additional usage of staff PPE, and extensive cleaning of exam room while observing appropriate contact time as indicated for disinfecting solutions.      The risk factors are reflected in the social history.  The roster of all physicians providing medical care to patient - is listed in the Snapshot section of the chart.  Activities of daily living:  The patient is 100% independent in all ADLs: dressing, toileting, feeding as well as independent mobility  Home safety : The patient has smoke detectors in the home. They wear seatbelts.  There are no firearms at home. There is no violence in the home.   There is no risks for hepatitis, STDs or HIV. There is no   history of blood transfusion. They have no travel history to infectious disease endemic areas of the world.  The patient has seen their dentist in the last six month. They have seen their eye doctor in the last year. They admit to slight hearing difficulty with regard to whispered voices and some television programs.  They have deferred audiologic testing in the last year.  They do not  have excessive sun exposure. Discussed the need for sun protection: hats, long sleeves and use of sunscreen if there is significant sun exposure.   Diet: the importance of a healthy diet is discussed. They do have a healthy diet.  The benefits of regular aerobic exercise were discussed. She walks 4 times per week ,  20 minutes.   Depression screen: there are no signs or vegative symptoms of depression- irritability, change in appetite, anhedonia, sadness/tearfullness.  The following portions of the patient's history were reviewed and updated as appropriate: allergies,  current medications, past family history, past medical history,  past surgical history, past social history  and problem list.  Visual acuity was not assessed per patient preference since she has regular follow up with her ophthalmologist. Hearing and body mass index were assessed and reviewed.   During the course of the visit the patient was educated and counseled about appropriate screening and preventive services including : fall prevention , diabetes screening, nutrition counseling, colorectal cancer screening, and recommended immunizations.    CC: The primary encounter diagnosis was Need for immunization against influenza. Diagnoses of Cervical cancer screening, Encounter for screening mammogram for malignant neoplasm of breast, Weight gain, Encounter for preventive health examination, Hyperlipidemia LDL goal <160, and Class 1 obesity due to excess calories without serious comorbidity with body mass index (BMI) of 34.0 to 34.9 in adult were also pertinent to this visit.  History Natalie Barr has a past medical history of Chicken pox (as a child).   She has a past surgical history that includes Cesarean section (1994); Wisdom tooth extraction (1985); and Colonoscopy with propofol (N/A, 03/06/2019).   Her family history includes Cancer in her father; Dementia in her maternal grandmother; Heart attack in her maternal grandfather and paternal grandfather.She reports that she has never smoked. She has never used smokeless tobacco. She reports that she does not drink alcohol and does not use drugs.  No outpatient medications prior to visit.   No facility-administered medications prior to visit.    Review of Systems   Patient denies headache, fevers, malaise, unintentional weight  loss, skin rash, eye pain, sinus congestion and sinus pain, sore throat, dysphagia,  hemoptysis , cough, dyspnea, wheezing, chest pain, palpitations, orthopnea, edema, abdominal pain, nausea, melena, diarrhea, constipation, flank  pain, dysuria, hematuria, urinary  Frequency, nocturia, numbness, tingling, seizures,  Focal weakness, Loss of consciousness,  Tremor, insomnia, depression, anxiety, and suicidal ideation.      Objective:  BP 124/86 (BP Location: Left Arm, Patient Position: Sitting, Cuff Size: Large)   Pulse 75   Temp 98.2 F (36.8 C) (Oral)   Resp 13   Ht 5\' 4"  (1.626 m)   Wt 200 lb 9.6 oz (91 kg)   LMP 11/26/2016   SpO2 99%   BMI 34.43 kg/m   Physical Exam  General Appearance:    Alert, cooperative, no distress, appears stated age  Head:    Normocephalic, without obvious abnormality, atraumatic  Eyes:    PERRL, conjunctiva/corneas clear, EOM's intact, fundi    benign, both eyes  Ears:    Normal TM's and external ear canals, both ears  Nose:   Nares normal, septum midline, mucosa normal, no drainage    or sinus tenderness  Throat:   Lips, mucosa, and tongue normal; teeth and gums normal  Neck:   Supple, symmetrical, trachea midline, no adenopathy;    thyroid:  no enlargement/tenderness/nodules; no carotid   bruit or JVD  Back:     Symmetric, no curvature, ROM normal, no CVA tenderness  Lungs:     Clear to auscultation bilaterally, respirations unlabored  Chest Wall:    No tenderness or deformity   Heart:    Regular rate and rhythm, S1 and S2 normal, no murmur, rub   or gallop  Breast Exam:    No tenderness, masses, or nipple abnormality  Abdomen:     Soft, non-tender, bowel sounds active all four quadrants,    no masses, no organomegaly  Genitalia:    Pelvic: cervix retroverted , external genitalia normal, no adnexal masses or tenderness, no cervical motion tenderness, rectovaginal septum normal, uterus normal size, shape, and consistency and vagina normal without discharge  Extremities:   Extremities normal, atraumatic, no cyanosis or edema  Pulses:   2+ and symmetric all extremities  Skin:   Skin color, texture, turgor normal, no rashes or lesions  Lymph nodes:   Cervical,  supraclavicular, and axillary nodes normal  Neurologic:   CNII-XII intact, normal strength, sensation and reflexes    throughout    Assessment & Plan:   Problem List Items Addressed This Visit      Unprioritized   Encounter for preventive health examination (Chronic)    age appropriate education and counseling updated, referrals for preventative services and immunizations addressed, dietary and smoking counseling addressed, most recent labs reviewed.  I have personally reviewed and have noted:  1) the patient's medical and social history 2) The pt's use of alcohol, tobacco, and illicit drugs 3) The patient's current medications and supplements 4) Functional ability including ADL's, fall risk, home safety risk, hearing and visual impairment 5) Diet and physical activities 6) Evidence for depression or mood disorder 7) The patient's height, weight, and BMI have been recorded in the chart  I have made referrals, and provided counseling and education based on review of the above      Hyperlipidemia LDL goal <160    10 year risk of CAD using last year's  FRC was 4.3%   Repeat labs are pending   Lab Results  Component Value Date   CHOL 260 (  H) 01/23/2019   HDL 55.30 01/23/2019   LDLCALC 171 (H) 01/23/2019   LDLDIRECT 139.0 11/29/2017   TRIG 167.0 (H) 01/23/2019   CHOLHDL 5 01/23/2019         Obesity    I have congratulated her in  Her temporary reduction of   BMI and encouraged  Continued weight loss with goal of 10% of body weigh over the next 6 months using a low glycemic index diet and regular exercise a minimum of 5 days per week.         Other Visit Diagnoses    Need for immunization against influenza    -  Primary   Relevant Orders   Flu Vaccine QUAD 36+ mos IM (Completed)   Cervical cancer screening       Relevant Orders   Cytology - PAP( [place])   Encounter for screening mammogram for malignant neoplasm of breast       Relevant Orders   MM 3D SCREEN  BREAST BILATERAL   Weight gain       Relevant Orders   Lipid panel   Comprehensive metabolic panel   TSH      I am having Natalie Barr start on Zoster Vaccine Adjuvanted.  Meds ordered this encounter  Medications  . Zoster Vaccine Adjuvanted [facility]) injection    Sig: Inject 0.5 mLs into the muscle once for 1 dose.    Dispense:  1 each    Refill:  1    There are no discontinued medications.  Follow-up: No follow-ups on file.   Crecencio Mc, MD

## 2020-05-01 LAB — LIPID PANEL
Cholesterol: 237 mg/dL — ABNORMAL HIGH (ref 0–200)
HDL: 52.6 mg/dL (ref >39.00)
LDL Cholesterol: 149 mg/dL — ABNORMAL HIGH (ref 0–99)
NonHDL: 184.61
Total CHOL/HDL Ratio: 5
Triglycerides: 179 mg/dL — ABNORMAL HIGH (ref 0.0–149.0)
VLDL: 35.8 mg/dL (ref 0.0–40.0)

## 2020-05-01 LAB — COMPREHENSIVE METABOLIC PANEL
ALT: 13 U/L (ref 0–35)
AST: 13 U/L (ref 0–37)
Albumin: 4.3 g/dL (ref 3.5–5.2)
Alkaline Phosphatase: 71 U/L (ref 39–117)
BUN: 10 mg/dL (ref 6–23)
CO2: 28 mEq/L (ref 19–32)
Calcium: 9.3 mg/dL (ref 8.4–10.5)
Chloride: 100 mEq/L (ref 96–112)
Creatinine, Ser: 0.66 mg/dL (ref 0.40–1.20)
GFR: 98.88 mL/min (ref >60.00)
Glucose, Bld: 84 mg/dL (ref 70–99)
Potassium: 3.8 mEq/L (ref 3.5–5.1)
Sodium: 139 mEq/L (ref 135–145)
Total Bilirubin: 0.6 mg/dL (ref 0.2–1.2)
Total Protein: 7.2 g/dL (ref 6.0–8.3)

## 2020-05-01 LAB — TSH: TSH: 1.53 u[IU]/mL (ref 0.35–4.50)

## 2020-05-02 LAB — CYTOLOGY - PAP
Adequacy: ABSENT
Comment: NEGATIVE
Diagnosis: NEGATIVE
High risk HPV: NEGATIVE

## 2020-05-02 IMAGING — MG DIGITAL SCREENING BILAT W/ TOMO W/ CAD
8 series · 8 of 24 positions shown · non-contrast
Comparison: Previous exam(s).

CLINICAL DATA: Screening.

EXAM:
DIGITAL SCREENING BILATERAL MAMMOGRAM WITH TOMO AND CAD

[L CC synth-2D]
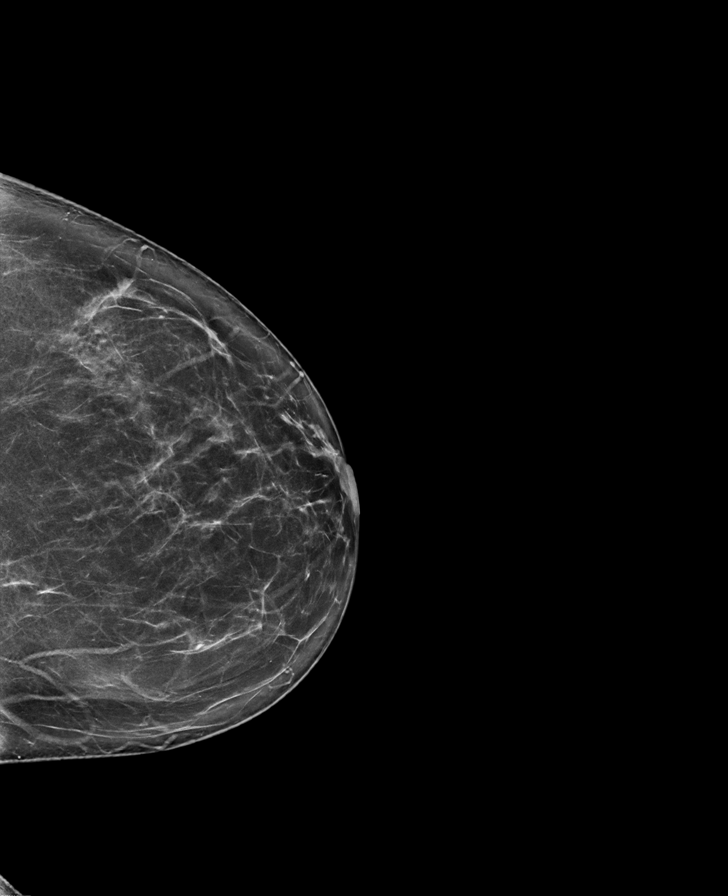

[R CC synth-2D]
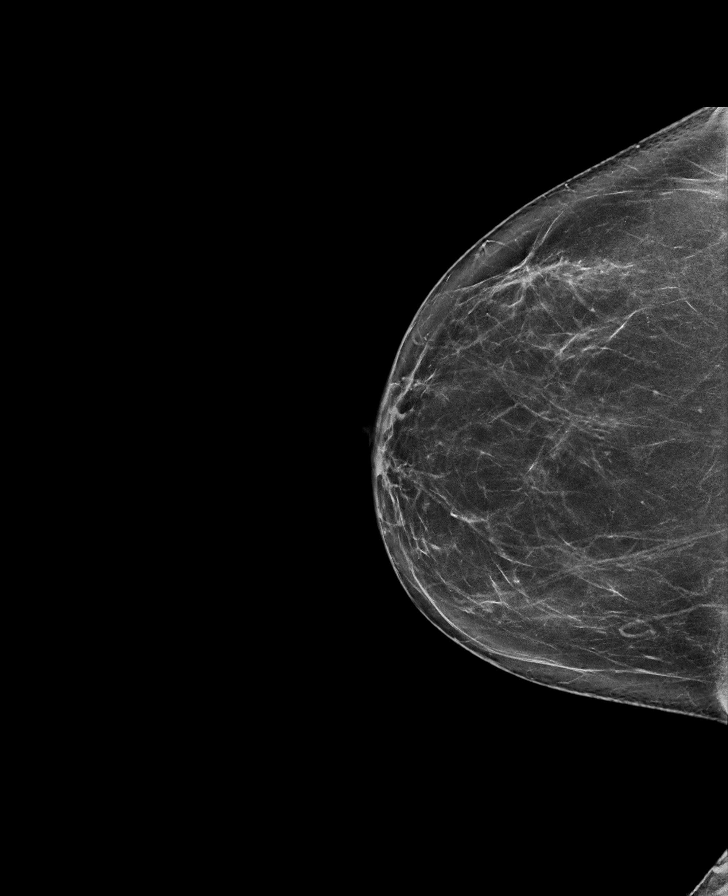

[L MLO synth-2D]
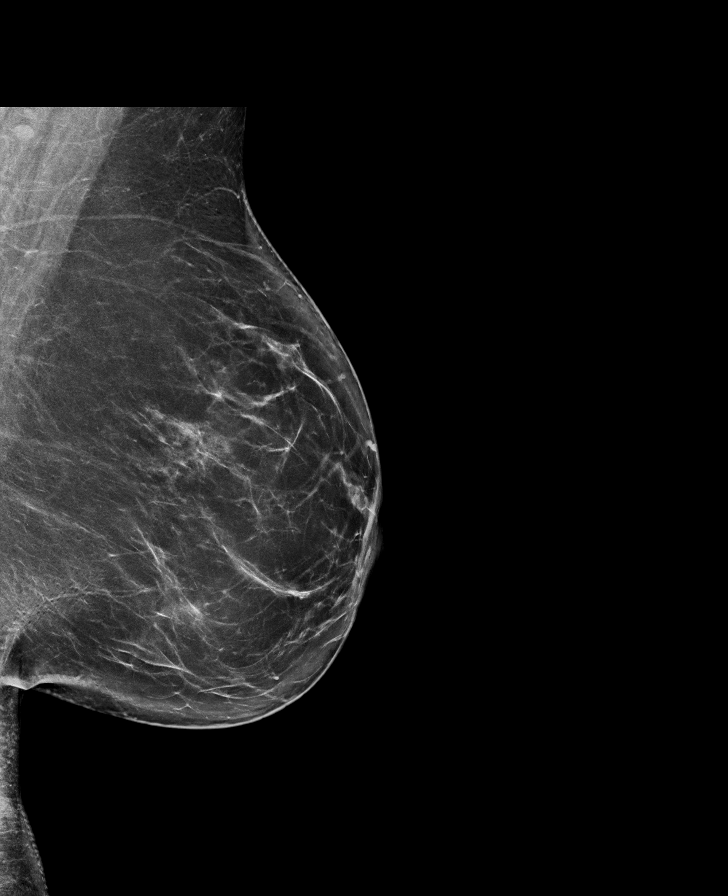

[R MLO synth-2D]
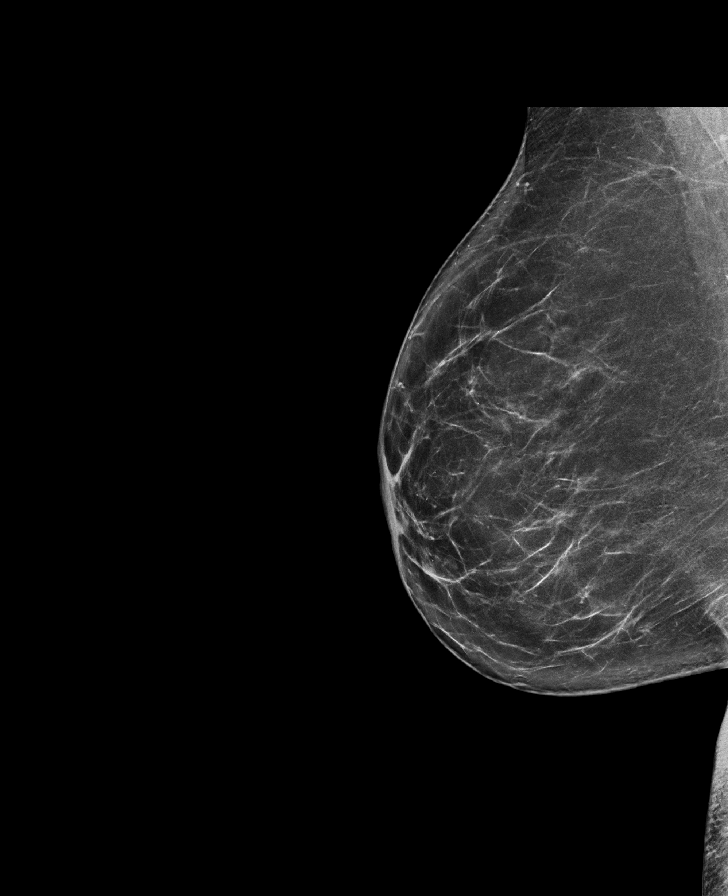

[R MLO tomo · tomo slice 43/86.0]
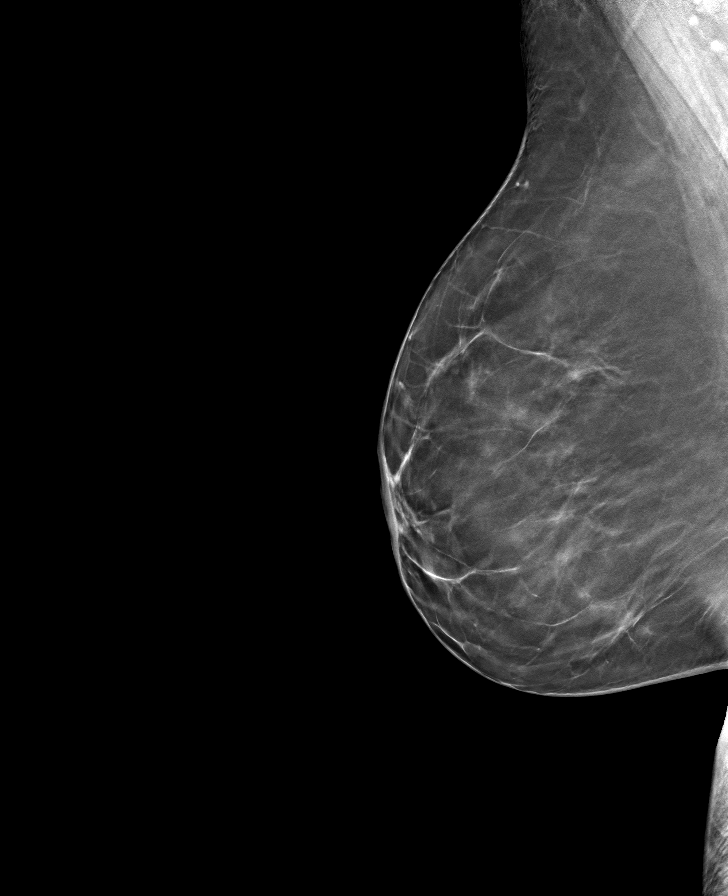

[R CC tomo · tomo slice 39/78.0]
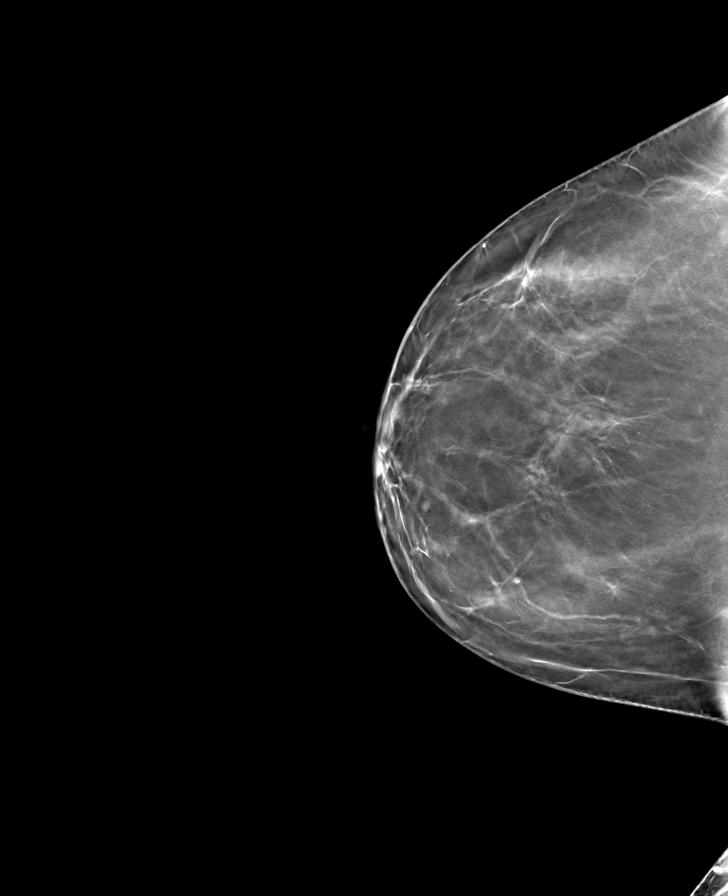

[L CC tomo · tomo slice 40/79.0]
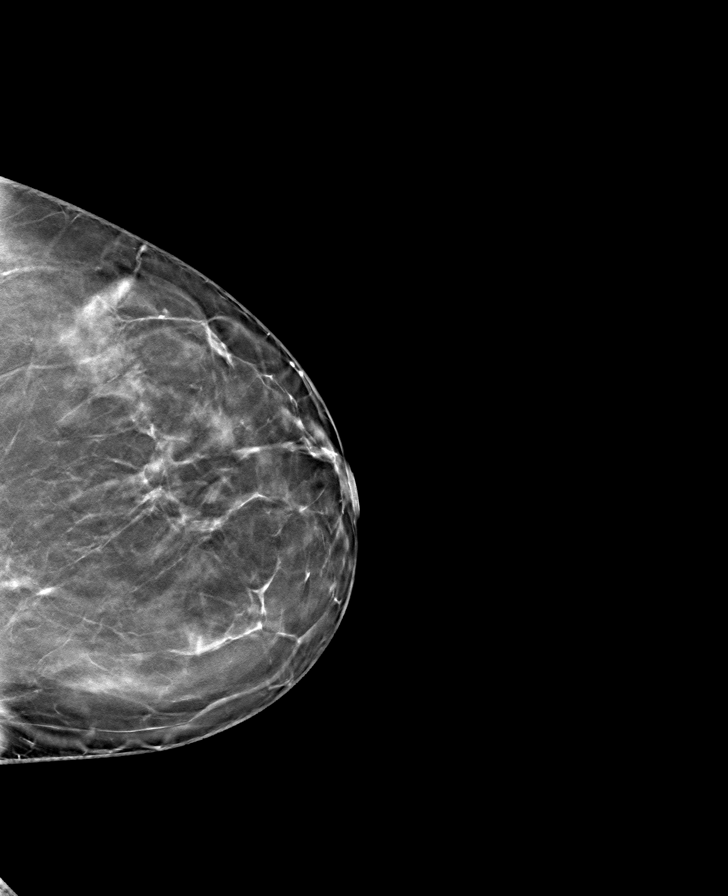

[L MLO tomo · tomo slice 47/92.0]
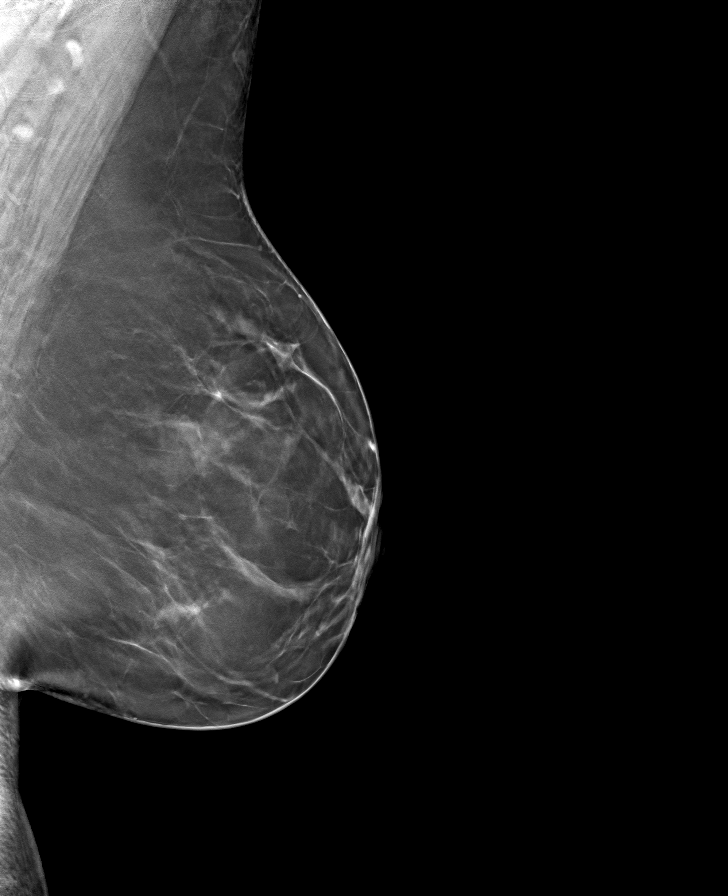

[8 of 24 positions shown; findings below may reference images not displayed]

ACR Breast Density Category b: There are scattered areas of
fibroglandular density.
FINDINGS: There are no findings suspicious for malignancy. Images were
processed with CAD.
IMPRESSION: No mammographic evidence of malignancy. A result letter of this
screening mammogram will be mailed directly to the patient.

RECOMMENDATION:
Screening mammogram in one year. (Code:[id number])

BI-RADS CATEGORY  1: Negative.

## 2020-05-02 NOTE — Progress Notes (Signed)
Your PAP smear was HPV negative but the endocervical zone was not present . Current guidelines for women over 30 with these results recommend continued routine screening,  So we will repeat your PAP smear in a year.    Your other labs were normal; cholesterol is improving and your .ttregverall risk for cardiac events is very low.     No medications are needed at this time.  We will repeat in one year .  Regards,   Deborra Medina, MD

## 2020-12-10 MED ORDER — FLUCONAZOLE 150 MG PO TABS: 150.0000 mg | ORAL_TABLET | Freq: Every day | ORAL | 0 refills | Status: DC

## 2021-05-01 ENCOUNTER — Encounter: Payer: 59 | Admitting: Internal Medicine

## 2021-06-12 ENCOUNTER — Encounter: Payer: 59 | Admitting: Internal Medicine

## 2021-07-31 ENCOUNTER — Other Ambulatory Visit: Payer: Self-pay

## 2021-07-31 ENCOUNTER — Ambulatory Visit (INDEPENDENT_AMBULATORY_CARE_PROVIDER_SITE_OTHER): Payer: 59 | Admitting: Internal Medicine

## 2021-07-31 ENCOUNTER — Other Ambulatory Visit (HOSPITAL_COMMUNITY)
Admission: RE | Admit: 2021-07-31 | Discharge: 2021-07-31 | Disposition: A | Discharge status: Home / Self Care | Payer: 59 | Source: Ambulatory Visit | Attending: Internal Medicine | Admitting: Internal Medicine

## 2021-07-31 ENCOUNTER — Encounter: Payer: Self-pay | Admitting: Internal Medicine

## 2021-07-31 VITALS — BP 132/86 | HR 73 | Temp 98.0°F | Ht 64.0 in | Wt 197.8 lb

## 2021-07-31 DIAGNOSIS — E785 Hyperlipidemia, unspecified: Secondary | ICD-10-CM | POA: Diagnosis not present

## 2021-07-31 DIAGNOSIS — Z124 Encounter for screening for malignant neoplasm of cervix: Secondary | ICD-10-CM | POA: Insufficient documentation

## 2021-07-31 DIAGNOSIS — Z1231 Encounter for screening mammogram for malignant neoplasm of breast: Secondary | ICD-10-CM | POA: Diagnosis not present

## 2021-07-31 DIAGNOSIS — Z0000 Encounter for general adult medical examination without abnormal findings: Secondary | ICD-10-CM

## 2021-07-31 DIAGNOSIS — R5383 Other fatigue: Secondary | ICD-10-CM | POA: Diagnosis not present

## 2021-07-31 DIAGNOSIS — D126 Benign neoplasm of colon, unspecified: Secondary | ICD-10-CM

## 2021-07-31 DIAGNOSIS — M7122 Synovial cyst of popliteal space [Baker], left knee: Secondary | ICD-10-CM | POA: Diagnosis not present

## 2021-07-31 DIAGNOSIS — Z6834 Body mass index (BMI) 34.0-34.9, adult: Secondary | ICD-10-CM

## 2021-07-31 DIAGNOSIS — R7301 Impaired fasting glucose: Secondary | ICD-10-CM

## 2021-07-31 DIAGNOSIS — Z8709 Personal history of other diseases of the respiratory system: Secondary | ICD-10-CM

## 2021-07-31 DIAGNOSIS — E6609 Other obesity due to excess calories: Secondary | ICD-10-CM

## 2021-07-31 LAB — COMPREHENSIVE METABOLIC PANEL
ALT: 16 U/L (ref 0–35)
AST: 14 U/L (ref 0–37)
Albumin: 4.6 g/dL (ref 3.5–5.2)
Alkaline Phosphatase: 69 U/L (ref 39–117)
BUN: 14 mg/dL (ref 6–23)
CO2: 33 mEq/L — ABNORMAL HIGH (ref 19–32)
Calcium: 9.4 mg/dL (ref 8.4–10.5)
Chloride: 99 mEq/L (ref 96–112)
Creatinine, Ser: 0.69 mg/dL (ref 0.40–1.20)
GFR: 96.98 mL/min (ref >60.00)
Glucose, Bld: 89 mg/dL (ref 70–99)
Potassium: 4 mEq/L (ref 3.5–5.1)
Sodium: 138 mEq/L (ref 135–145)
Total Bilirubin: 0.6 mg/dL (ref 0.2–1.2)
Total Protein: 7.4 g/dL (ref 6.0–8.3)

## 2021-07-31 LAB — CBC WITH DIFFERENTIAL/PLATELET
Basophils Absolute: 0 10*3/uL (ref 0.0–0.1)
Basophils Relative: 0.4 % (ref 0.0–3.0)
Eosinophils Absolute: 0.1 10*3/uL (ref 0.0–0.7)
Eosinophils Relative: 1.8 % (ref 0.0–5.0)
HCT: 40.7 % (ref 36.0–46.0)
Hemoglobin: 13.5 g/dL (ref 12.0–15.0)
Lymphocytes Relative: 25.3 % (ref 12.0–46.0)
Lymphs Abs: 1.7 10*3/uL (ref 0.7–4.0)
MCHC: 33.2 g/dL (ref 30.0–36.0)
MCV: 96.4 fl (ref 78.0–100.0)
Monocytes Absolute: 0.5 10*3/uL (ref 0.1–1.0)
Monocytes Relative: 8.1 % (ref 3.0–12.0)
Neutro Abs: 4.3 10*3/uL (ref 1.4–7.7)
Neutrophils Relative %: 64.4 % (ref 43.0–77.0)
Platelets: 326 10*3/uL (ref 150.0–400.0)
RBC: 4.23 Mil/uL (ref 3.87–5.11)
RDW: 12.6 % (ref 11.5–15.5)
WBC: 6.7 10*3/uL (ref 4.0–10.5)

## 2021-07-31 LAB — LIPID PANEL
Cholesterol: 251 mg/dL — ABNORMAL HIGH (ref 0–200)
HDL: 56.1 mg/dL (ref >39.00)
NonHDL: 194.4
Total CHOL/HDL Ratio: 4
Triglycerides: 254 mg/dL — ABNORMAL HIGH (ref 0.0–149.0)
VLDL: 50.8 mg/dL — ABNORMAL HIGH (ref 0.0–40.0)

## 2021-07-31 LAB — LDL CHOLESTEROL, DIRECT: Direct LDL: 152 mg/dL

## 2021-07-31 LAB — TSH: TSH: 1.08 u[IU]/mL (ref 0.35–5.50)

## 2021-07-31 LAB — HEMOGLOBIN A1C: Hgb A1c MFr Bld: 5.6 % (ref 4.6–6.5)

## 2021-07-31 MED ORDER — TETANUS-DIPHTH-ACELL PERTUSSIS 5-2.5-18.5 LF-MCG/0.5 IM SUSY: 0.5000 mL | PREFILLED_SYRINGE | Freq: Once | INTRAMUSCULAR | 0 refills | Status: AC

## 2021-07-31 MED ORDER — ZOSTER VAC RECOMB ADJUVANTED 50 MCG/0.5ML IM SUSR: 0.5000 mL | Freq: Once | INTRAMUSCULAR | 1 refills | Status: AC

## 2021-07-31 NOTE — Progress Notes (Signed)
The patient is here for annual preventive examination and management of other chronic and acute problems.  This visit occurred during the SARS-CoV-2 public health emergency.  Safety protocols were in place, including screening questions prior to the visit, additional usage of staff PPE, and extensive cleaning of exam room while observing appropriate contact time as indicated for disinfecting solutions.     The risk factors are reflected in the social history.   The roster of all physicians providing medical care to patient - is listed in the Snapshot section of the chart.   Activities of daily living:  The patient is 100% independent in all ADLs: dressing, toileting, feeding as well as independent mobility   Home safety : The patient has smoke detectors in the home. They wear seatbelts.  There are no unsecured firearms at home. There is no violence in the home.    There is no risks for hepatitis, STDs or HIV. There is no   history of blood transfusion. They have no travel history to infectious disease endemic areas of the world.   The patient has seen their dentist in the last six month. They have seen their eye doctor in the last year. The patinet  denies slight hearing difficulty with regard to whispered voices and some television programs.  They have deferred audiologic testing in the last year.  They do not  have excessive sun exposure. Discussed the need for sun protection: hats, long sleeves and use of sunscreen if there is significant sun exposure.    Diet: the importance of a healthy diet is discussed. They do have a healthy diet.   The benefits of regular aerobic exercise were discussed. The patient  exercises  3 to 5 days per week  for  60 minutes.    Depression screen: there are no signs or vegative symptoms of depression- irritability, change in appetite, anhedonia, sadness/tearfullness.   The following portions of the patient's history were reviewed and updated as appropriate:  allergies, current medications, past family history, past medical history,  past surgical history, past social history  and problem list.   Visual acuity was not assessed per patient preference since the patient has regular follow up with an  ophthalmologist. Hearing and body mass index were assessed and reviewed.    During the course of the visit the patient was educated and counseled about appropriate screening and preventive services including : fall prevention , diabetes screening, nutrition counseling, colorectal cancer screening, and recommended immunizations.    Chief Complaint:  HPI     Annual Exam    Additional comments: Physical       Last edited by Adair Laundry, [place] on 07/31/2021 10:33 AM.      1) Weight gain:    she initiated a iscussion indicating that she has reconciled her previous indifference to losing weight and is now extremely motivated to lose weight  Reviewed her exercise plans described by patient.  . Reviewed previous attempts using pharmacotherapy, current available agents,  and the relative success of various restrictive diets available.    Review of Symptoms  Patient denies headache, fevers, malaise, unintentional weight loss, skin rash, eye pain, sinus congestion and sinus pain, sore throat, dysphagia,  hemoptysis , cough, dyspnea, wheezing, chest pain, palpitations, orthopnea, edema, abdominal pain, nausea, melena, diarrhea, constipation, flank pain, dysuria, hematuria, urinary  Frequency, nocturia, numbness, tingling, seizures,  Focal weakness, Loss of consciousness,  Tremor, insomnia, depression, anxiety, and suicidal ideation.    Physical Exam:  BP 132/86 (  BP Location: Left Arm, Patient Position: Sitting, Cuff Size: Large)    Pulse 73    Temp 98 F (36.7 C) (Oral)    Ht 5\' 4"  (1.626 m)    Wt 197 lb 12.8 oz (89.7 kg)    LMP 11/26/2016    SpO2 99%    BMI 33.95 kg/m    General Appearance:    Alert, cooperative, no distress, appears stated age  Head:     Normocephalic, without obvious abnormality, atraumatic  Eyes:    PERRL, conjunctiva/corneas clear, EOM's intact, fundi    benign, both eyes  Ears:    Normal TM's and external ear canals, both ears  Nose:   Nares normal, septum midline, mucosa normal, no drainage    or sinus tenderness  Throat:   Lips, mucosa, and tongue normal; teeth and gums normal  Neck:   Supple, symmetrical, trachea midline, no adenopathy;    thyroid:  no enlargement/tenderness/nodules; no carotid   bruit or JVD  Back:     Symmetric, no curvature, ROM normal, no CVA tenderness  Lungs:     Clear to auscultation bilaterally, respirations unlabored  Chest Wall:    No tenderness or deformity   Heart:    Regular rate and rhythm, S1 and S2 normal, no murmur, rub   or gallop  Breast Exam:    No tenderness, masses, or nipple abnormality  Abdomen:     Soft, non-tender, bowel sounds active all four quadrants,    no masses, no organomegaly  Genitalia:    Pelvic: cervix normal in appearance, external genitalia normal, no adnexal masses or tenderness, cervix not visible using adult large speculum.  rectovaginal septum normal, uterus normal size, shape, and consistency and vagina normal without discharge  Extremities:   Extremities normal, atraumatic, no cyanosis or edema  Pulses:   2+ and symmetric all extremities  Skin:   Skin color, texture, turgor normal, no rashes or lesions  Lymph nodes:   Cervical, supraclavicular, and axillary nodes normal  Neurologic:   CNII-XII intact, normal strength, sensation and reflexes    throughout      Assessment and Plan:  Encounter for preventive health examination age appropriate education and counseling updated, referrals for preventative services and immunizations addressed, dietary and smoking counseling addressed, most recent labs reviewed.  I have personally reviewed and have noted:   1) the patient's medical and social history 2) The pt's use of alcohol, tobacco, and illicit  drugs 3) The patient's current medications and supplements 4) Functional ability including ADL's, fall risk, home safety risk, hearing and visual impairment 5) Diet and physical activities 6) Evidence for depression or mood disorder 7) The patient's height, weight, and BMI have been recorded in the chart    I have made referrals, and provided counseling and education based on review of the above  Baker's cyst of knee, left Found during workup for knee pain  Secondary to meniscal tear (MRI by [facility])  Treated with meloxicam   Hyperlipidemia LDL goal <160 10 year risk of CAD using  Current AHA calculator is < 3%.  No treatment indicated   Lab Results  Component Value Date   CHOL 251 (H) 07/31/2021   HDL 56.10 07/31/2021   LDLCALC 149 (H) 04/30/2020   LDLDIRECT 152.0 07/31/2021   TRIG 254.0 (H) 07/31/2021   CHOLHDL 4 07/31/2021     Obesity I have addressed  BMI and recommended wt loss of 10% of body weigh over the next 6 months using  a low glycemic index diet and regular exercise a minimum of 5 days per week.    Updated Medication List Outpatient Encounter Medications as of 07/31/2021  Medication Sig   Calcium Carbonate-Vit D-Min (CALCIUM 1200 PO) Take 1 capsule by mouth as needed.   cholecalciferol (VITAMIN D3) 25 MCG (1000 UNIT) tablet Take 1,000 Units by mouth as needed.   meloxicam (MOBIC) 15 MG tablet Take 15 mg by mouth as needed.   Multiple Vitamin (MULTIVITAMIN) tablet Take 1 tablet by mouth daily.   [EXPIRED] Tdap (BOOSTRIX) 5-2.5-18.5 LF-MCG/0.5 injection Inject 0.5 mLs into the muscle once for 1 dose.   [EXPIRED] Zoster Vaccine Adjuvanted [facility]) injection Inject 0.5 mLs into the muscle once for 1 dose.   [DISCONTINUED] fluconazole (DIFLUCAN) 150 MG tablet Take 1 tablet (150 mg total) by mouth daily.   No facility-administered encounter medications on file as of 07/31/2021.

## 2021-07-31 NOTE — Assessment & Plan Note (Addendum)
Found during workup for knee pain  Secondary to meniscal tear (MRI by [facility])  Treated with meloxicam

## 2021-07-31 NOTE — Patient Instructions (Signed)
°  The Tdap (tetanus-diphtheria-whooping cough vaccine ) and Shingrix vaccines are now  available at your pharmacy    You can expect 24 hours of flu like symptoms after receiving the shingles vaccine, so plan accordingly.

## 2021-07-31 NOTE — Assessment & Plan Note (Signed)

## 2021-08-01 LAB — CYTOLOGY - PAP
Adequacy: ABSENT
Comment: NEGATIVE
Diagnosis: NEGATIVE
High risk HPV: NEGATIVE

## 2021-08-02 NOTE — Assessment & Plan Note (Signed)
I have addressed  BMI and recommended wt loss of 10% of body weigh over the next 6 months using a low glycemic index diet and regular exercise a minimum of 5 days per week.   

## 2021-08-02 NOTE — Assessment & Plan Note (Addendum)
10 year risk of CAD using  Current AHA calculator is < 3%.  No treatment indicated   Lab Results  Component Value Date   CHOL 251 (H) 07/31/2021   HDL 56.10 07/31/2021   LDLCALC 149 (H) 04/30/2020   LDLDIRECT 152.0 07/31/2021   TRIG 254.0 (H) 07/31/2021   CHOLHDL 4 07/31/2021

## 2021-08-08 ENCOUNTER — Telehealth: Payer: Self-pay | Admitting: Internal Medicine

## 2021-08-08 NOTE — Telephone Encounter (Signed)
Patient scheduled for visit with Dr Derrel Nip on Monday to leave urine sample and discuss best treatment

## 2021-08-08 NOTE — Telephone Encounter (Signed)
Patient called about requesting medication for UTI.

## 2021-08-08 NOTE — Telephone Encounter (Signed)
Pt called in requesting for refill on medication (fluconazole (DIFLUCAN) 150 MG tablet). Pt stated that she thinks that she have an UTI. Pt stated that Dr. Derrel Nip prescribe medication to her without seeing her. Pt stated that Dr. Derrel Nip prescribe above medication before. Pt requesting callback.

## 2021-08-11 ENCOUNTER — Ambulatory Visit (INDEPENDENT_AMBULATORY_CARE_PROVIDER_SITE_OTHER): Payer: 59 | Admitting: Internal Medicine

## 2021-08-11 ENCOUNTER — Other Ambulatory Visit: Payer: Self-pay

## 2021-08-11 ENCOUNTER — Encounter: Payer: Self-pay | Admitting: Internal Medicine

## 2021-08-11 VITALS — BP 120/86 | HR 73 | Temp 97.9°F | Ht 64.0 in | Wt 197.6 lb

## 2021-08-11 DIAGNOSIS — R300 Dysuria: Secondary | ICD-10-CM | POA: Diagnosis not present

## 2021-08-11 DIAGNOSIS — N760 Acute vaginitis: Secondary | ICD-10-CM

## 2021-08-11 MED ORDER — CLOBETASOL PROPIONATE 0.05 % EX CREA: 1.0000 "application " | TOPICAL_CREAM | Freq: Two times a day (BID) | CUTANEOUS | 0 refills | Status: DC

## 2021-08-11 NOTE — Assessment & Plan Note (Signed)
PAP was normal 10 days ago and speculum exam normal today.  Will treat for irritation with clobetasol cream bid x 7 days

## 2021-08-11 NOTE — Progress Notes (Signed)
Subjective:  Patient ID: Natalie Barr, female    DOB: Dec 21, 1965  Age: 56 y.o. MRN: [id number]  CC: The primary encounter diagnosis was Dysuria. A diagnosis of Acute vaginitis was also pertinent to this visit.   This visit occurred during the SARS-CoV-2 public health emergency.  Safety protocols were in place, including screening questions prior to the visit, additional usage of staff PPE, and extensive cleaning of exam room while observing appropriate contact time as indicated for disinfecting solutions.    HPI Natalie Barr presents for  Chief Complaint  Patient presents with   Urinary Tract Infection    1) vaginal irritation/itching started 2 days after PAP smear.  No discharge  no pain with urination   Outpatient Medications Prior to Visit  Medication Sig Dispense Refill   Calcium Carbonate-Vit D-Min (CALCIUM 1200 PO) Take 1 capsule by mouth as needed.     cholecalciferol (VITAMIN D3) 25 MCG (1000 UNIT) tablet Take 1,000 Units by mouth as needed.     meloxicam (MOBIC) 15 MG tablet Take 15 mg by mouth as needed.     Multiple Vitamin (MULTIVITAMIN) tablet Take 1 tablet by mouth daily.     No facility-administered medications prior to visit.    Review of Systems;  Patient denies headache, fevers, malaise, unintentional weight loss, skin rash, eye pain, sinus congestion and sinus pain, sore throat, dysphagia,  hemoptysis , cough, dyspnea, wheezing, chest pain, palpitations, orthopnea, edema, abdominal pain, nausea, melena, diarrhea, constipation, flank pain, dysuria, hematuria, urinary  Frequency, nocturia, numbness, tingling, seizures,  Focal weakness, Loss of consciousness,  Tremor, insomnia, depression, anxiety, and suicidal ideation.      Objective:  BP 120/86 (BP Location: Left Arm, Patient Position: Sitting, Cuff Size: Normal)    Pulse 73    Temp 97.9 F (36.6 C) (Oral)    Ht 5\' 4"  (1.626 m)    Wt 197 lb 9.6 oz (89.6 kg)    LMP 11/26/2016    SpO2 99%    BMI 33.92 kg/m    BP Readings from Last 3 Encounters:  08/11/21 120/86  07/31/21 132/86  04/30/20 124/86    Wt Readings from Last 3 Encounters:  08/11/21 197 lb 9.6 oz (89.6 kg)  07/31/21 197 lb 12.8 oz (89.7 kg)  04/30/20 200 lb 9.6 oz (91 kg)    General Appearance:    Alert, cooperative, no distress, appears stated age  Head:    Normocephalic, without obvious abnormality, atraumatic  Eyes:    PERRL, conjunctiva/corneas clear, EOM's intact, fundi    benign, both eyes           Neck:   Supple, symmetrical, trachea midline, no adenopathy;    thyroid:  no enlargement/tenderness/nodules; no carotid   bruit or JVD     Lungs:     Clear to auscultation bilaterally, respirations unlabored  Chest Wall:    No tenderness or deformity   Heart:    Regular rate and rhythm, S1 and S2 normal, no murmur, rub   or gallop        Genitalia:    Pelvic: cervix normal in appearance, external genitalia normal, no adnexal masses or tenderness, no cervical motion tenderness, rectovaginal septum normal, uterus normal size, shape, and consistency and vagina normal without discharge                   Lab Results  Component Value Date   HGBA1C 5.6 07/31/2021   HGBA1C 5.3 01/23/2019    Lab Results  Component  Value Date   CREATININE 0.69 07/31/2021   CREATININE 0.66 04/30/2020   CREATININE 0.66 01/23/2019    Lab Results  Component Value Date   WBC 6.7 07/31/2021   HGB 13.5 07/31/2021   HCT 40.7 07/31/2021   PLT 326.0 07/31/2021   GLUCOSE 89 07/31/2021   CHOL 251 (H) 07/31/2021   TRIG 254.0 (H) 07/31/2021   HDL 56.10 07/31/2021   LDLDIRECT 152.0 07/31/2021   LDLCALC 149 (H) 04/30/2020   ALT 16 07/31/2021   AST 14 07/31/2021   NA 138 07/31/2021   K 4.0 07/31/2021   CL 99 07/31/2021   CREATININE 0.69 07/31/2021   BUN 14 07/31/2021   CO2 33 (H) 07/31/2021   TSH 1.08 07/31/2021   HGBA1C 5.6 07/31/2021    No results found.  Assessment & Plan:   Problem List Items Addressed This Visit      Vaginitis    PAP was normal 10 days ago and speculum exam normal today.  Will treat for irritation with clobetasol cream bid x 7 days       Other Visit Diagnoses     Dysuria    -  Primary       I spent 30 minutes dedicated to the care of this patient on the date of this encounter to include pre-visit review of patient's medical history,  most recent imaging studies, Face-to-face time with the patient , and post visit ordering of testing and therapeutics.    Follow-up: No follow-ups on file.   Crecencio Mc, MD

## 2021-08-11 NOTE — Patient Instructions (Signed)
I am treating you for "itching"  with a steroid cream you can apply twice daily to your labia.  Symptoms should resolved in  1 week or less   Your cholesterol is "normal" because your , based on application of the American Heart Association's guidelines  ,  your 10 year risk of CAD using  Current AHA calculator is < 3%.  No treatment indicated

## 2021-10-01 ENCOUNTER — Ambulatory Visit
Admission: RE | Admit: 2021-10-01 | Discharge: 2021-10-01 | Disposition: A | Discharge status: Home / Self Care | Payer: 59 | Source: Ambulatory Visit | Attending: Internal Medicine | Admitting: Internal Medicine

## 2021-10-01 DIAGNOSIS — Z1231 Encounter for screening mammogram for malignant neoplasm of breast: Secondary | ICD-10-CM | POA: Diagnosis present

## 2022-05-26 NOTE — Telephone Encounter (Signed)
MyChart messgae sent to patient. 

## 2022-08-06 ENCOUNTER — Ambulatory Visit (INDEPENDENT_AMBULATORY_CARE_PROVIDER_SITE_OTHER): Payer: 59 | Admitting: Internal Medicine

## 2022-08-06 ENCOUNTER — Encounter: Payer: Self-pay | Admitting: Internal Medicine

## 2022-08-06 VITALS — BP 118/80 | HR 76 | Temp 98.2°F | Ht 64.0 in | Wt 203.8 lb

## 2022-08-06 DIAGNOSIS — Z1231 Encounter for screening mammogram for malignant neoplasm of breast: Secondary | ICD-10-CM | POA: Diagnosis not present

## 2022-08-06 DIAGNOSIS — Z23 Encounter for immunization: Secondary | ICD-10-CM | POA: Diagnosis not present

## 2022-08-06 DIAGNOSIS — Z0000 Encounter for general adult medical examination without abnormal findings: Secondary | ICD-10-CM

## 2022-08-06 DIAGNOSIS — R5383 Other fatigue: Secondary | ICD-10-CM

## 2022-08-06 DIAGNOSIS — D126 Benign neoplasm of colon, unspecified: Secondary | ICD-10-CM

## 2022-08-06 DIAGNOSIS — E785 Hyperlipidemia, unspecified: Secondary | ICD-10-CM | POA: Diagnosis not present

## 2022-08-06 DIAGNOSIS — R7301 Impaired fasting glucose: Secondary | ICD-10-CM

## 2022-08-06 DIAGNOSIS — E6609 Other obesity due to excess calories: Secondary | ICD-10-CM

## 2022-08-06 DIAGNOSIS — Z6834 Body mass index (BMI) 34.0-34.9, adult: Secondary | ICD-10-CM

## 2022-08-06 LAB — COMPREHENSIVE METABOLIC PANEL
ALT: 18 U/L (ref 0–35)
AST: 16 U/L (ref 0–37)
Albumin: 4.5 g/dL (ref 3.5–5.2)
Alkaline Phosphatase: 73 U/L (ref 39–117)
BUN: 13 mg/dL (ref 6–23)
CO2: 29 mEq/L (ref 19–32)
Calcium: 9.5 mg/dL (ref 8.4–10.5)
Chloride: 103 mEq/L (ref 96–112)
Creatinine, Ser: 0.73 mg/dL (ref 0.40–1.20)
GFR: 91.24 mL/min (ref >60.00)
Glucose, Bld: 97 mg/dL (ref 70–99)
Potassium: 4.3 mEq/L (ref 3.5–5.1)
Sodium: 141 mEq/L (ref 135–145)
Total Bilirubin: 0.5 mg/dL (ref 0.2–1.2)
Total Protein: 7.3 g/dL (ref 6.0–8.3)

## 2022-08-06 LAB — LDL CHOLESTEROL, DIRECT: Direct LDL: 162 mg/dL

## 2022-08-06 LAB — LIPID PANEL
Cholesterol: 260 mg/dL — ABNORMAL HIGH (ref 0–200)
HDL: 55.8 mg/dL (ref >39.00)
LDL Cholesterol: 165 mg/dL — ABNORMAL HIGH (ref 0–99)
NonHDL: 203.7
Total CHOL/HDL Ratio: 5
Triglycerides: 192 mg/dL — ABNORMAL HIGH (ref 0.0–149.0)
VLDL: 38.4 mg/dL (ref 0.0–40.0)

## 2022-08-06 LAB — CBC WITH DIFFERENTIAL/PLATELET
Basophils Absolute: 0 10*3/uL (ref 0.0–0.1)
Basophils Relative: 0.8 % (ref 0.0–3.0)
Eosinophils Absolute: 0.2 10*3/uL (ref 0.0–0.7)
Eosinophils Relative: 3.3 % (ref 0.0–5.0)
HCT: 39.7 % (ref 36.0–46.0)
Hemoglobin: 13.2 g/dL (ref 12.0–15.0)
Lymphocytes Relative: 24.6 % (ref 12.0–46.0)
Lymphs Abs: 1.4 10*3/uL (ref 0.7–4.0)
MCHC: 33.3 g/dL (ref 30.0–36.0)
MCV: 98.7 fl (ref 78.0–100.0)
Monocytes Absolute: 0.5 10*3/uL (ref 0.1–1.0)
Monocytes Relative: 9.5 % (ref 3.0–12.0)
Neutro Abs: 3.5 10*3/uL (ref 1.4–7.7)
Neutrophils Relative %: 61.8 % (ref 43.0–77.0)
Platelets: 378 10*3/uL (ref 150.0–400.0)
RBC: 4.03 Mil/uL (ref 3.87–5.11)
RDW: 12.4 % (ref 11.5–15.5)
WBC: 5.6 10*3/uL (ref 4.0–10.5)

## 2022-08-06 LAB — TSH: TSH: 1.22 u[IU]/mL (ref 0.35–5.50)

## 2022-08-06 LAB — HEMOGLOBIN A1C: Hgb A1c MFr Bld: 5.2 % (ref 4.6–6.5)

## 2022-08-06 MED ORDER — NYSTATIN 100000 UNIT/GM EX OINT: 1.0000 | TOPICAL_OINTMENT | Freq: Two times a day (BID) | CUTANEOUS | 0 refills | Status: AC

## 2022-08-06 NOTE — Progress Notes (Signed)
Patient ID: Natalie Barr, female    DOB: Aug 01, 1965  Age: 57 y.o. MRN: [id number]  The patient is here for annual preventive examination and management of other chronic and acute problems.   The risk factors are reflected in the social history.   The roster of all physicians providing medical care to patient - is listed in the Snapshot section of the chart.   Activities of daily living:  The patient is 100% independent in all ADLs: dressing, toileting, feeding as well as independent mobility   Home safety : The patient has smoke detectors in the home. They wear seatbelts.  There are no unsecured firearms at home. There is no violence in the home.    There is no risks for hepatitis, STDs or HIV. There is no   history of blood transfusion. They have no travel history to infectious disease endemic areas of the world.   The patient has seen their dentist in the last six month. They have seen their eye doctor in the last year. The patinet  denies slight hearing difficulty with regard to whispered voices and some television programs.  They have deferred audiologic testing in the last year.  They do not  have excessive sun exposure. Discussed the need for sun protection: hats, long sleeves and use of sunscreen if there is significant sun exposure.    Diet: the importance of a healthy diet is discussed. They do have a healthy diet.   The benefits of regular aerobic exercise were discussed. The patient  exercises  3 to 5 days per week  for  60 minutes.    Depression screen: there are no signs or vegative symptoms of depression- irritability, change in appetite, anhedonia, sadness/tearfullness.   The following portions of the patient's history were reviewed and updated as appropriate: allergies, current medications, past family history, past medical history,  past surgical history, past social history  and problem list.   Visual acuity was not assessed per patient preference since the patient has  regular follow up with an  ophthalmologist. Hearing and body mass index were assessed and reviewed.    During the course of the visit the patient was educated and counseled about appropriate screening and preventive services including : fall prevention , diabetes screening, nutrition counseling, colorectal cancer screening, and recommended immunizations.    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  Has divested herself emotionally.   Mother now living in [place] with sister since brother died, so sees her less,  but Natalie Barr is actually doing better.  Sleeping well  .  Daughter got married    Cheilosis .  Attributed to excess moisture from drooling at  night .  using an Nystatin  ointment from dentist,  resolving  Review of Symptoms  Patient denies headache, fevers, malaise, unintentional weight loss, skin rash, eye pain, sinus congestion and sinus pain, sore throat, dysphagia,  hemoptysis , cough, dyspnea, wheezing, chest pain, palpitations, orthopnea, edema, abdominal pain, nausea, melena, diarrhea, constipation, flank pain, dysuria, hematuria, urinary  Frequency, nocturia, numbness, tingling, seizures,  Focal weakness, Loss of consciousness,  Tremor, insomnia, depression, anxiety, and suicidal ideation.    Physical Exam:  BP 118/80   Pulse 76   Temp 98.2 F (36.8 C) (Oral)   Ht '5\' 4"'$  (1.626 m)   Wt 203 lb 12.8 oz (92.4 kg)   LMP 11/26/2016   SpO2 99%  BMI 34.98 kg/m    Physical Exam Vitals reviewed.  Constitutional:      General: She is not in acute distress.    Appearance: Normal appearance. She is well-developed and normal weight. She is not ill-appearing, toxic-appearing or diaphoretic.  HENT:     Head: Normocephalic.     Right Ear: Tympanic membrane, ear canal and external ear normal. There is no impacted cerumen.     Left Ear: Tympanic membrane, ear canal and external ear normal. There is  no impacted cerumen.     Nose: Nose normal.     Mouth/Throat:     Mouth: Mucous membranes are moist.     Pharynx: Oropharynx is clear.  Eyes:     General: No scleral icterus.       Right eye: No discharge.        Left eye: No discharge.     Conjunctiva/sclera: Conjunctivae normal.     Pupils: Pupils are equal, round, and reactive to light.  Neck:     Thyroid: No thyromegaly.     Vascular: No carotid bruit or JVD.  Cardiovascular:     Rate and Rhythm: Normal rate and regular rhythm.     Heart sounds: Normal heart sounds.  Pulmonary:     Effort: Pulmonary effort is normal. No respiratory distress.     Breath sounds: Normal breath sounds.  Chest:  Breasts:    Breasts are symmetrical.     Right: Normal. No swelling, inverted nipple, mass, nipple discharge, skin change or tenderness.     Left: Normal. No swelling, inverted nipple, mass, nipple discharge, skin change or tenderness.  Abdominal:     General: Bowel sounds are normal.     Palpations: Abdomen is soft. There is no mass.     Tenderness: There is no abdominal tenderness. There is no guarding or rebound.  Musculoskeletal:        General: Normal range of motion.     Cervical back: Normal range of motion and neck supple.  Lymphadenopathy:     Cervical: No cervical adenopathy.     Upper Body:     Right upper body: No supraclavicular, axillary or pectoral adenopathy.     Left upper body: No supraclavicular, axillary or pectoral adenopathy.  Skin:    General: Skin is warm and dry.  Neurological:     General: No focal deficit present.     Mental Status: She is alert and oriented to person, place, and time. Mental status is at baseline.  Psychiatric:        Mood and Affect: Mood normal.        Behavior: Behavior normal.        Thought Content: Thought content normal.        Judgment: Judgment normal.    Assessment and Plan: Hyperlipidemia LDL goal <160 Assessment & Plan: based on current lipid profile, the risk of  clinically significant Coronary artery disease is <3 %  Lab Results  Component Value Date   CHOL 260 (H) 08/06/2022   HDL 55.80 08/06/2022   LDLCALC 165 (H) 08/06/2022   LDLDIRECT 162.0 08/06/2022   TRIG 192.0 (H) 08/06/2022   CHOLHDL 5 08/06/2022     Orders: -     Lipid panel -     LDL cholesterol, direct  Encounter for preventive health examination Assessment & Plan: age appropriate education and counseling updated, referrals for preventative services and immunizations addressed, dietary and smoking counseling addressed, most recent labs reviewed.  I have  personally reviewed and have noted:   1) the patient's medical and social history 2) The pt's use of alcohol, tobacco, and illicit drugs 3) The patient's current medications and supplements 4) Functional ability including ADL's, fall risk, home safety risk, hearing and visual impairment 5) Diet and physical activities 6) Evidence for depression or mood disorder 7) The patient's height, weight, and BMI have been recorded in the chart  I have made referrals, and provided counseling and education based on review of the above    Other fatigue -     TSH -     CBC with Differential/Platelet  Impaired fasting glucose -     Comprehensive metabolic panel -     Hemoglobin A1c  Encounter for screening mammogram for malignant neoplasm of breast -     3D Screening Mammogram, Left and Right; Future  Need for Tdap vaccination -     Tdap vaccine greater than or equal to 7yo IM  Class 1 obesity due to excess calories without serious comorbidity with body mass index (BMI) of 34.0 to 34.9 in adult Assessment & Plan: I have addressed  BMI and recommended wt loss of 10% of body weight over the next 6 months using portion reduction, a low glycemic index diet and regular exercise a minimum of 5 days per week.    Screening for secondary causes negative  Lab Results  Component Value Date   HGBA1C 5.2 08/06/2022   Lab Results  Component  Value Date   TSH 1.22 08/06/2022      Tubular adenoma of colon Assessment & Plan: Due for repeat scope in 2025   Other orders -     Nystatin; Apply 1 Application topically 2 (two) times daily.  Dispense: 30 g; Refill: 0    Return in about 1 year (around 08/07/2023) for physical.  Crecencio Mc, MD

## 2022-08-06 NOTE — Patient Instructions (Signed)
Your annual mammogram has been ordered AND IS DUE in LATE  APRIL.  [facility] will not allow [place] to schedule it for you,  so please  call to make your appointment [phone]

## 2022-08-08 NOTE — Assessment & Plan Note (Signed)
Due for repeat scope in 2025

## 2022-08-08 NOTE — Assessment & Plan Note (Signed)
based on current lipid profile, the risk of clinically significant Coronary artery disease is <3 %  Lab Results  Component Value Date   CHOL 260 (H) 08/06/2022   HDL 55.80 08/06/2022   LDLCALC 165 (H) 08/06/2022   LDLDIRECT 162.0 08/06/2022   TRIG 192.0 (H) 08/06/2022   CHOLHDL 5 08/06/2022

## 2022-08-08 NOTE — Assessment & Plan Note (Signed)

## 2022-08-08 NOTE — Assessment & Plan Note (Addendum)
I have addressed  BMI and recommended wt loss of 10% of body weight over the next 6 months using portion reduction, a low glycemic index diet and regular exercise a minimum of 5 days per week.    Screening for secondary causes negative  Lab Results  Component Value Date   HGBA1C 5.2 08/06/2022   Lab Results  Component Value Date   TSH 1.22 08/06/2022

## 2022-10-05 ENCOUNTER — Ambulatory Visit
Admission: RE | Admit: 2022-10-05 | Discharge: 2022-10-05 | Disposition: A | Discharge status: Home / Self Care | Payer: Managed Care, Other (non HMO) | Source: Ambulatory Visit | Attending: Internal Medicine | Admitting: Internal Medicine

## 2022-10-05 DIAGNOSIS — Z1231 Encounter for screening mammogram for malignant neoplasm of breast: Secondary | ICD-10-CM | POA: Diagnosis present

## 2023-03-23 ENCOUNTER — Telehealth: Payer: Self-pay

## 2023-03-23 ENCOUNTER — Telehealth: Payer: Managed Care, Other (non HMO) | Admitting: Physician Assistant

## 2023-03-23 DIAGNOSIS — S0502XA Injury of conjunctiva and corneal abrasion without foreign body, left eye, initial encounter: Secondary | ICD-10-CM

## 2023-03-23 MED ORDER — NEOMYCIN-POLYMYXIN-HC 3.5-10000-1 OP SUSP: 2.0000 [drp] | Freq: Four times a day (QID) | OPHTHALMIC | 0 refills | Status: DC

## 2023-03-23 MED ORDER — ERYTHROMYCIN 5 MG/GM OP OINT: 1.0000 | TOPICAL_OINTMENT | Freq: Three times a day (TID) | OPHTHALMIC | 0 refills | Status: AC

## 2023-03-23 MED ORDER — ERYTHROMYCIN 5 MG/GM OP OINT: 1.0000 | TOPICAL_OINTMENT | Freq: Three times a day (TID) | OPHTHALMIC | 0 refills | Status: DC

## 2023-03-23 NOTE — Patient Instructions (Signed)
Natalie Barr, thank you for joining [name], PA-C for today's virtual visit.  While this provider is not your primary care provider (PCP), if your PCP is located in our provider database this encounter information will be shared with them immediately following your visit.   A [facility] MyChart account gives you access to today's visit and all your visits, tests, and labs performed at [facility] " click here if you don't have a [facility] MyChart account or go to [email or web address]  Consent: (Patient) Natalie Barr provided verbal consent for this virtual visit at the beginning of the encounter.  Current Medications:  Current Outpatient Medications:    neomycin-polymyxin-hydrocortisone (CORTISPORIN) 3.5-10000-1 ophthalmic suspension, Place 2 drops into the left eye 4 (four) times daily. For 5 days, Disp: 7.5 mL, Rfl: 0   Calcium Carbonate-Vit D-Min (CALCIUM 1200 PO), Take 1 capsule by mouth as needed., Disp: , Rfl:    cholecalciferol (VITAMIN D3) 25 MCG (1000 UNIT) tablet, Take 1,000 Units by mouth as needed., Disp: , Rfl:    clobetasol cream (TEMOVATE) 0.05 %, Apply 1 application topically 2 (two) times daily. Until itching has resolved, Disp: 30 g, Rfl: 0   meloxicam (MOBIC) 15 MG tablet, Take 15 mg by mouth as needed., Disp: , Rfl:    Multiple Vitamin (MULTIVITAMIN) tablet, Take 1 tablet by mouth daily., Disp: , Rfl:    nystatin ointment (MYCOSTATIN), Apply 1 Application topically 2 (two) times daily., Disp: 30 g, Rfl: 0   Medications ordered in this encounter:  Meds ordered this encounter  Medications   neomycin-polymyxin-hydrocortisone (CORTISPORIN) 3.5-10000-1 ophthalmic suspension    Sig: Place 2 drops into the left eye 4 (four) times daily. For 5 days    Dispense:  7.5 mL    Refill:  0    Order Specific Question:   Supervising Provider    Answer:   Merrilee Jansky [[id number]]     *If you need refills on other medications prior to your next  appointment, please contact your pharmacy*  Follow-Up: Call back or seek an in-person evaluation if the symptoms worsen or if the condition fails to improve as anticipated.  [facility] [phone]  Other Instructions Corneal Abrasion  A corneal abrasion is a scratch or injury to the clear tissue at the front of the eye (cornea). It can be very painful. The cornea protects the eye and helps the eye focus. The cornea is made up of many layers, but the surface layer is one of the most sensitive tissues in the body. A corneal abrasion heals fast when it is treated. If it is not treated, it can become infected and cause an open sore (ulcer). This can lead to scarring, and the scarring can affect vision. Sometimes after the abrasion heals, another abrasion can come back in the same place. What are the causes? A corneal abrasion may be caused by: A poke to the eye. A gritty or irritating substance (foreign body) in the eye. Too much eye rubbing. Very dry eyes. Certain eye infections. Contact lenses that do not fit right or are worn for too long. Injury can also happen when putting in contact lenses or taking them out. Eye surgery. Certain cornea problems that make it more likely to get a corneal abrasion. Sometimes, the cause is not known. What are the signs or symptoms? Symptoms of this condition include: Eye pain. The pain may get worse when you open, close, or move your eye. A feeling that something  is poking your eye or is stuck in your eye. Tearing, redness, and sensitivity to light. Having trouble keeping your eye open, or not being able to keep it open. Blurred vision. Headache. How is this diagnosed? A corneal abrasion may be diagnosed based on your medical history, symptoms, and an eye exam. You may need to see a doctor who specializes in conditions of the eye (ophthalmologist or optometrist). Before the eye exam, you may be given eye drops that numb the eye. You  may also have dye put in your eye with a dropper or a small paper strip. This dye helps your eye doctor see your injury better when using a light or an eye scope (slit lamp). How is this treated? Treatment may vary based on what caused the corneal abrasion. It may include: Washing out your eye. Taking out anything that is stuck in your eye. Using antibiotic drops or ointment to treat or prevent an infection. Using eye drops that lessens inflammation and pain. Using steroid drops or ointment to treat redness, irritation, or inflammation. Applying a cold, wet cloth (cold compress) or ice pack to lessen the pain. Taking pain medicines. This may include over-the-counter medicines like ibuprofen. If you have a large or deep corneal abrasion, an opioid medicine may be prescribed. For large abrasions, an eye patch or a bandage soft contact lens might also be used. A bandage soft contact lens protects the cornea while it is healing. These are not used if the corneal abrasion is related to wearing contact lenses. This is because you may be more likely to get an eye infection. Follow these instructions at home: Medicines If you were prescribed eye drops or ointment, use them as told by your provider. You may be told to use: Antibiotic eye drops or ointment. Do not stop using them even if you start to feel better. Eye drops that moisten the eye (lubricating eye drops). Ask your provider if the medicine prescribed to you: Requires you to avoid driving or using machinery. Can cause constipation. You may need to take these actions to prevent or treat constipation: Drink enough fluid to keep your pee (urine) pale yellow. Take over-the-counter or prescription medicines. Eat foods that are high in fiber, such as beans, whole grains, and fresh fruits and vegetables. Limit foods that are high in fat and processed sugars, such as fried or sweet foods. Take over-the-counter and prescription medicines only as told  by your provider. Eye care Rest your eyes. Avoid bright light and overusing (straining) your eyes. Wear sunglasses. Do not rub or touch your eye. Do not wash out your eye. Ask your provider if you can use a cold compress on your eye to lessen pain. If you have an eye patch: Wear it as told by your provider. Follow your provider's instructions about when to take it off. If you have a bandage soft contact lens, your provider will take it off during your follow-up visit. Do not wear your own contact lenses until your provider says it is okay. General instructions Do not drive or use machinery as told by your provider. You may not be able to judge distances as well if your vision is affected or if you are wearing an eye patch. Keep all follow-up visits. This helps to prevent infection and vision loss. Contact a health care provider if: You keep having eye pain and other symptoms for more than 2-3 days. You have new symptoms, such as more redness, tearing, or fluid (discharge) coming  from your eye. You have swollen eyelids. Your vision gets much worse. You have discharge that makes your eyelids stick together in the morning. Your eye patch becomes so loose that you can blink your eye. Symptoms come back after your abrasion heals. Get help right away if: You have severe eye pain that does not get better with medicine. You have severe vision loss. This information is not intended to replace advice given to you by your health care provider. Make sure you discuss any questions you have with your health care provider. Document Revised: 06/18/2022 Document Reviewed: 06/18/2022 Elsevier Patient Education  2024 Elsevier Inc.    If you have been instructed to have an in-person evaluation today at a local Urgent Care facility, please use the link below. It will take you to a list of all of our available [facility], including address, phone number and hours of operation. Please do not  delay care.  [facility]  If you or a family member do not have a primary care provider, use the link below to schedule a visit and establish care. When you choose a [facility] primary care physician or advanced practice provider, you gain a long-term partner in health. Find a Primary Care Provider  Learn more about [facility]'s in-office and virtual care options: [facility] - Get Care Now

## 2023-03-23 NOTE — Telephone Encounter (Signed)
I relayed Joycelyn Man, PA-C's message to patient.  Patient was appreciative.

## 2023-03-23 NOTE — Progress Notes (Signed)
Virtual Visit Consent   Natalie Barr, you are scheduled for a virtual visit with a [facility] provider today. Just as with appointments in the office, your consent must be obtained to participate. Your consent will be active for this visit and any virtual visit you may have with one of our providers in the next 365 days. If you have a MyChart account, a copy of this consent can be sent to you electronically.  As this is a virtual visit, video technology does not allow for your provider to perform a traditional examination. This may limit your provider's ability to fully assess your condition. If your provider identifies any concerns that need to be evaluated in person or the need to arrange testing (such as labs, EKG, etc.), we will make arrangements to do so. Although advances in technology are sophisticated, we cannot ensure that it will always work on either your end or our end. If the connection with a video visit is poor, the visit may have to be switched to a telephone visit. With either a video or telephone visit, we are not always able to ensure that we have a secure connection.  By engaging in this virtual visit, you consent to the provision of healthcare and authorize for your insurance to be billed (if applicable) for the services provided during this visit. Depending on your insurance coverage, you may receive a charge related to this service.  I need to obtain your verbal consent now. Are you willing to proceed with your visit today? Natalie Barr has provided verbal consent on 03/23/2023 for a virtual visit (video or telephone). [name], PA-C  Date: 03/23/2023 8:48 AM  Virtual Visit via Video Note   I, [name], connected with  Natalie Barr  ([id number], 01/01/1966) on 03/23/23 at  8:45 AM EDT by a video-enabled telemedicine application and verified that I am speaking with the correct person using two identifiers.  Location: Patient: Virtual Visit Location  Patient: Mobile Provider: Virtual Visit Location Provider: Home Office   I discussed the limitations of evaluation and management by telemedicine and the availability of in person appointments. The patient expressed understanding and agreed to proceed.    History of Present Illness: Natalie Barr is a 57 y.o. who identifies as a female who was assigned female at birth, and is being seen today for eye complaint.  HPI: Eye Problem  The left eye is affected. This is a new problem. The current episode started yesterday. The problem occurs constantly. The problem has been gradually worsening. The injury mechanism was a direct trauma (dropped hairbrush and it hit her left eye directly). The pain is mild. There is No known exposure to pink eye. She Does not wear contacts. Associated symptoms include an eye discharge (watering), eye redness and a foreign body sensation. Pertinent negatives include no blurred vision, double vision, fever, itching, nausea, photophobia or vomiting. She has tried eye drops for the symptoms. The treatment provided no relief.     Problems:  Patient Active Problem List   Diagnosis Date Noted   Tubular adenoma of colon 07/31/2021   Baker's cyst of knee, left 07/31/2021   History of tonsillitis 07/31/2021   Vitamin D deficiency 08/25/2015   Encounter for preventive health examination 05/05/2014   Hyperlipidemia LDL goal <160 07/10/2013   Obesity 07/10/2013    Allergies: No Known Allergies Medications:  Current Outpatient Medications:    neomycin-polymyxin-hydrocortisone (CORTISPORIN) 3.5-10000-1 ophthalmic suspension, Place 2 drops into the left eye 4 (four)  times daily. For 5 days, Disp: 7.5 mL, Rfl: 0   Calcium Carbonate-Vit D-Min (CALCIUM 1200 PO), Take 1 capsule by mouth as needed., Disp: , Rfl:    cholecalciferol (VITAMIN D3) 25 MCG (1000 UNIT) tablet, Take 1,000 Units by mouth as needed., Disp: , Rfl:    clobetasol cream (TEMOVATE) 0.05 %, Apply 1 application  topically 2 (two) times daily. Until itching has resolved, Disp: 30 g, Rfl: 0   meloxicam (MOBIC) 15 MG tablet, Take 15 mg by mouth as needed., Disp: , Rfl:    Multiple Vitamin (MULTIVITAMIN) tablet, Take 1 tablet by mouth daily., Disp: , Rfl:    nystatin ointment (MYCOSTATIN), Apply 1 Application topically 2 (two) times daily., Disp: 30 g, Rfl: 0  Observations/Objective: Patient is well-developed, well-nourished in no acute distress.  Resting comfortably  Head is normocephalic, atraumatic.  No labored breathing.  Speech is clear and coherent with logical content.  Patient is alert and oriented at baseline.    Assessment and Plan: 1. Abrasion of left cornea, initial encounter - neomycin-polymyxin-hydrocortisone (CORTISPORIN) 3.5-10000-1 ophthalmic suspension; Place 2 drops into the left eye 4 (four) times daily. For 5 days  Dispense: 7.5 mL; Refill: 0  - Suspect possible corneal abrasion vs bruising - Cortisporin ophthalmic prescribed - Warm compresses - Good hand hygiene - Seek in person evaluation if symptoms worsen or fail to improve   Follow Up Instructions: I discussed the assessment and treatment plan with the patient. The patient was provided an opportunity to ask questions and all were answered. The patient agreed with the plan and demonstrated an understanding of the instructions.  A copy of instructions were sent to the patient via MyChart unless otherwise noted below.    The patient was advised to call ack or seek an in-person evaluation if the symptoms worsen or if the condition fails to improve as anticipated.   [name], PA-C

## 2023-03-23 NOTE — Telephone Encounter (Signed)
Erythromycin ointment sent to [facility] [address]  She can take Ibuprofen 400-600mg  three times daily as needed for inflammation and pain.

## 2023-03-23 NOTE — Telephone Encounter (Signed)
Patient states she had a virtual visit today with Joycelyn Man, PA-C.  Patient states Joycelyn Man, [place], called in a prescription for her, but her pharmacy does not have this medication in stock.  Patient states her pharmacy has reached out to [facility], [place], to see if she can prescribe an alternate medication.  Patient states she would like to have this medication as soon as possible.  Patient states she is [facility] in [facility] [address] in [place], so we may send the prescription there.  Patient states she would like for someone to please call her when the [facility] has been [facility] to [facility] on [address] in [place].

## 2023-08-12 ENCOUNTER — Encounter: Payer: 59 | Admitting: Internal Medicine

## 2023-09-08 ENCOUNTER — Encounter: Payer: 59 | Admitting: Internal Medicine

## 2023-09-08 ENCOUNTER — Encounter: Payer: Self-pay | Admitting: Internal Medicine

## 2023-09-08 ENCOUNTER — Ambulatory Visit (INDEPENDENT_AMBULATORY_CARE_PROVIDER_SITE_OTHER): Payer: 59 | Admitting: Internal Medicine

## 2023-09-08 VITALS — BP 142/90 | HR 75 | Ht 64.0 in | Wt 207.6 lb

## 2023-09-08 DIAGNOSIS — Z0001 Encounter for general adult medical examination with abnormal findings: Secondary | ICD-10-CM

## 2023-09-08 DIAGNOSIS — R7301 Impaired fasting glucose: Secondary | ICD-10-CM | POA: Diagnosis not present

## 2023-09-08 DIAGNOSIS — Z0000 Encounter for general adult medical examination without abnormal findings: Secondary | ICD-10-CM

## 2023-09-08 DIAGNOSIS — I10 Essential (primary) hypertension: Secondary | ICD-10-CM | POA: Diagnosis not present

## 2023-09-08 DIAGNOSIS — E785 Hyperlipidemia, unspecified: Secondary | ICD-10-CM

## 2023-09-08 DIAGNOSIS — Z1211 Encounter for screening for malignant neoplasm of colon: Secondary | ICD-10-CM

## 2023-09-08 DIAGNOSIS — R5383 Other fatigue: Secondary | ICD-10-CM

## 2023-09-08 DIAGNOSIS — Z1231 Encounter for screening mammogram for malignant neoplasm of breast: Secondary | ICD-10-CM

## 2023-09-08 DIAGNOSIS — N6452 Nipple discharge: Secondary | ICD-10-CM | POA: Diagnosis not present

## 2023-09-08 NOTE — Progress Notes (Unsigned)
 Patient ID: Natalie Barr, female    DOB: [birth year]  Age: 58 y.o. MRN: [id number]  The patient is here for annual preventive examination and management of other chronic and acute problems.   The risk factors are reflected in the social history.   The roster of all physicians providing medical care to patient - is listed in the Snapshot section of the chart.   Activities of daily living:  The patient is 100% independent in all ADLs: dressing, toileting, feeding as well as independent mobility   Home safety : The patient has smoke detectors in the home. They wear seatbelts.  There are no unsecured firearms at home. There is no violence in the home.    There is no risks for hepatitis, STDs or HIV. There is no   history of blood transfusion. They have no travel history to infectious disease endemic areas of the world.   The patient has seen their dentist in the last six month. They have seen their eye doctor in the last year. The patinet  denies slight hearing difficulty with regard to whispered voices and some television programs.  They have deferred audiologic testing in the last year.  They do not  have excessive sun exposure. Discussed the need for sun protection: hats, long sleeves and use of sunscreen if there is significant sun exposure.    Diet: the importance of a healthy diet is discussed. They do have a healthy diet.   The benefits of regular aerobic exercise were discussed. The patient  exercises  3 to 5 days per week  for  60 minutes.    Depression screen: there are no signs or vegative symptoms of depression- irritability, change in appetite, anhedonia, sadness/tearfullness.   The following portions of the patient's history were reviewed and updated as appropriate: allergies, current medications, past family history, past medical history,  past surgical history, past social history  and problem list.   Visual acuity was not assessed per patient preference since the patient has  regular follow up with an  ophthalmologist. Hearing and body mass index were assessed and reviewed.    During the course of the visit the patient was educated and counseled about appropriate screening and preventive services including : fall prevention , diabetes screening, nutrition counseling, colorectal cancer screening, and recommended immunizations.    Chief Complaint:  NIPPLE DISCHARGE ,  CLEAR  OCCRING 2-3 TIMES PER WEEK FOR THE LAST 4 MONTHS .  NO APIAN , HEADACHES OR VISION CHANGES.      Review of Symptoms  Patient denies headache, fevers, malaise, unintentional weight loss, skin rash, eye pain, sinus congestion and sinus pain, sore throat, dysphagia,  hemoptysis , cough, dyspnea, wheezing, chest pain, palpitations, orthopnea, edema, abdominal pain, nausea, melena, diarrhea, constipation, flank pain, dysuria, hematuria, urinary  Frequency, nocturia, numbness, tingling, seizures,  Focal weakness, Loss of consciousness,  Tremor, insomnia, depression, anxiety, and suicidal ideation.    Physical Exam:  BP (!) 142/90   Pulse 75   Ht 5\' 4"  (1.626 m)   Wt 207 lb 9.6 oz (94.2 kg)   LMP 11/26/2016   SpO2 97%   BMI 35.63 kg/m    Physical Exam Vitals reviewed.  Constitutional:      General: She is not in acute distress.    Appearance: Normal appearance. She is well-developed and normal weight. She is not ill-appearing, toxic-appearing or diaphoretic.  HENT:     Head: Normocephalic.     Right Ear: Tympanic membrane, ear canal and  external ear normal. There is no impacted cerumen.     Left Ear: Tympanic membrane, ear canal and external ear normal. There is no impacted cerumen.     Nose: Nose normal.     Mouth/Throat:     Mouth: Mucous membranes are moist.     Pharynx: Oropharynx is clear.  Eyes:     General: No scleral icterus.       Right eye: No discharge.        Left eye: No discharge.     Conjunctiva/sclera: Conjunctivae normal.     Pupils: Pupils are equal, round, and  reactive to light.  Neck:     Thyroid: No thyromegaly.     Vascular: No carotid bruit or JVD.  Cardiovascular:     Rate and Rhythm: Normal rate and regular rhythm.     Heart sounds: Normal heart sounds.  Pulmonary:     Effort: Pulmonary effort is normal. No respiratory distress.     Breath sounds: Normal breath sounds.  Chest:  Breasts:    Breasts are symmetrical.     Right: Normal. No swelling, inverted nipple, mass, nipple discharge, skin change or tenderness.     Left: Normal. No swelling, inverted nipple, mass, nipple discharge, skin change or tenderness.  Abdominal:     General: Bowel sounds are normal.     Palpations: Abdomen is soft. There is no mass.     Tenderness: There is no abdominal tenderness. There is no guarding or rebound.  Musculoskeletal:        General: Normal range of motion.     Cervical back: Normal range of motion and neck supple.  Lymphadenopathy:     Cervical: No cervical adenopathy.     Upper Body:     Right upper body: No supraclavicular, axillary or pectoral adenopathy.     Left upper body: No supraclavicular, axillary or pectoral adenopathy.  Skin:    General: Skin is warm and dry.  Neurological:     General: No focal deficit present.     Mental Status: She is alert and oriented to person, place, and time. Mental status is at baseline.  Psychiatric:        Mood and Affect: Mood normal.        Behavior: Behavior normal.        Thought Content: Thought content normal.        Judgment: Judgment normal.    Assessment and Plan: Encounter for screening mammogram for malignant neoplasm of breast -     MM 3D DIAGNOSTIC MAMMOGRAM BILATERAL BREAST; Future  Hyperlipidemia LDL goal <160 -     Lipid panel -     LDL cholesterol, direct  Encounter for general adult medical examination with abnormal findings Assessment & Plan: age appropriate education and counseling updated, referrals for preventative services and immunizations addressed, dietary and  smoking counseling addressed, most recent labs reviewed.  I have personally reviewed and have noted:   1) the patient's medical and social history 2) The pt's use of alcohol, tobacco, and illicit drugs 3) The patient's current medications and supplements 4) Functional ability including ADL's, fall risk, home safety risk, hearing and visual impairment 5) Diet and physical activities 6) Evidence for depression or mood disorder 7) The patient's height, weight, and BMI have been recorded in the chart    I have made referrals, and provided counseling and education based on review of the above    Other fatigue -     TSH -  CBC with Differential/Platelet  Impaired fasting glucose -     Hemoglobin A1c -     Comprehensive metabolic panel with GFR  Colon cancer screening -     Ambulatory referral to Gastroenterology  Discharge from left nipple -     [place] LIMITED ULTRASOUND INCLUDING AXILLA LEFT BREAST ; Future  Elevated blood pressure reading with diagnosis of hypertension Assessment & Plan: NOT TAKING NSAIDS   Orders: -     Microalbumin / creatinine urine ratio    No follow-ups on file.  Sherlene Shams, MD

## 2023-09-08 NOTE — Assessment & Plan Note (Signed)
 NOT TAKING NSAIDS

## 2023-09-08 NOTE — Patient Instructions (Signed)
 YOUR BLOOD PRESSURE IS HIGH TODAY  It should be around 130/80 or less.  Please check your blood pressure a few times at home and send me the readings so I can determine if you need to increase your MEDICATION.   Diagnostic mammogram has been ordered   I want you to focus on losing 20 lbs over the next 6 months using a low glycemic indext diet and EXERCISE

## 2023-09-09 LAB — COMPREHENSIVE METABOLIC PANEL WITH GFR
ALT: 15 U/L (ref 0–35)
AST: 15 U/L (ref 0–37)
Albumin: 4.2 g/dL (ref 3.5–5.2)
Alkaline Phosphatase: 65 U/L (ref 39–117)
BUN: 12 mg/dL (ref 6–23)
CO2: 28 meq/L (ref 19–32)
Calcium: 8.9 mg/dL (ref 8.4–10.5)
Chloride: 101 meq/L (ref 96–112)
Creatinine, Ser: 0.64 mg/dL (ref 0.40–1.20)
GFR: 97.3 mL/min (ref >60.00)
Glucose, Bld: 81 mg/dL (ref 70–99)
Potassium: 3.7 meq/L (ref 3.5–5.1)
Sodium: 137 meq/L (ref 135–145)
Total Bilirubin: 0.7 mg/dL (ref 0.2–1.2)
Total Protein: 7.1 g/dL (ref 6.0–8.3)

## 2023-09-09 LAB — CBC WITH DIFFERENTIAL/PLATELET
Basophils Absolute: 0.1 10*3/uL (ref 0.0–0.1)
Basophils Relative: 0.7 % (ref 0.0–3.0)
Eosinophils Absolute: 0.2 10*3/uL (ref 0.0–0.7)
Eosinophils Relative: 2.5 % (ref 0.0–5.0)
HCT: 38.5 % (ref 36.0–46.0)
Hemoglobin: 12.9 g/dL (ref 12.0–15.0)
Lymphocytes Relative: 27.5 % (ref 12.0–46.0)
Lymphs Abs: 1.9 10*3/uL (ref 0.7–4.0)
MCHC: 33.7 g/dL (ref 30.0–36.0)
MCV: 98.2 fl (ref 78.0–100.0)
Monocytes Absolute: 0.6 10*3/uL (ref 0.1–1.0)
Monocytes Relative: 9.1 % (ref 3.0–12.0)
Neutro Abs: 4.2 10*3/uL (ref 1.4–7.7)
Neutrophils Relative %: 60.2 % (ref 43.0–77.0)
Platelets: 333 10*3/uL (ref 150.0–400.0)
RBC: 3.91 Mil/uL (ref 3.87–5.11)
RDW: 13.2 % (ref 11.5–15.5)
WBC: 7 10*3/uL (ref 4.0–10.5)

## 2023-09-09 LAB — MICROALBUMIN / CREATININE URINE RATIO
Creatinine,U: 19.1 mg/dL
Microalb Creat Ratio: UNDETERMINED mg/g (ref 0.0–30.0)
Microalb, Ur: -0.1 mg/dL — ABNORMAL LOW (ref 0.0–1.9)

## 2023-09-09 LAB — LIPID PANEL
Cholesterol: 230 mg/dL — ABNORMAL HIGH (ref 0–200)
HDL: 51.3 mg/dL (ref >39.00)
LDL Cholesterol: 144 mg/dL — ABNORMAL HIGH (ref 0–99)
NonHDL: 178.95
Total CHOL/HDL Ratio: 4
Triglycerides: 173 mg/dL — ABNORMAL HIGH (ref 0.0–149.0)
VLDL: 34.6 mg/dL (ref 0.0–40.0)

## 2023-09-09 LAB — LDL CHOLESTEROL, DIRECT: Direct LDL: 155 mg/dL

## 2023-09-09 LAB — HEMOGLOBIN A1C: Hgb A1c MFr Bld: 5.3 % (ref 4.6–6.5)

## 2023-09-09 NOTE — Assessment & Plan Note (Signed)
 I have addressed  BMI and recommended wt loss of 10% of body weight over the next 6 months using portion reduction, a low glycemic index diet and regular exercise a minimum of 5 days per week.    Lab Results  Component Value Date   HGBA1C 5.2 08/06/2022   Lab Results  Component Value Date   TSH 1.22 08/06/2022

## 2023-09-09 NOTE — Assessment & Plan Note (Signed)

## 2023-09-10 LAB — TSH: TSH: 1.11 u[IU]/mL (ref 0.35–5.50)

## 2023-09-12 ENCOUNTER — Encounter: Payer: Self-pay | Admitting: Internal Medicine

## 2023-09-21 ENCOUNTER — Encounter: Payer: Self-pay | Admitting: *Deleted

## 2023-09-23 ENCOUNTER — Encounter

## 2023-09-23 ENCOUNTER — Other Ambulatory Visit

## 2023-09-30 ENCOUNTER — Ambulatory Visit
Admission: RE | Admit: 2023-09-30 | Discharge: 2023-09-30 | Disposition: A | Discharge status: Home / Self Care | Source: Ambulatory Visit | Attending: Internal Medicine | Admitting: Internal Medicine

## 2023-09-30 DIAGNOSIS — N6452 Nipple discharge: Secondary | ICD-10-CM | POA: Insufficient documentation

## 2023-09-30 DIAGNOSIS — Z1231 Encounter for screening mammogram for malignant neoplasm of breast: Secondary | ICD-10-CM | POA: Insufficient documentation

## 2023-10-01 ENCOUNTER — Other Ambulatory Visit: Payer: Self-pay | Admitting: Internal Medicine

## 2023-10-01 DIAGNOSIS — R928 Other abnormal and inconclusive findings on diagnostic imaging of breast: Secondary | ICD-10-CM

## 2023-10-01 DIAGNOSIS — N63 Unspecified lump in unspecified breast: Secondary | ICD-10-CM

## 2023-10-06 ENCOUNTER — Other Ambulatory Visit

## 2023-10-06 ENCOUNTER — Encounter

## 2023-10-08 ENCOUNTER — Ambulatory Visit
Admission: RE | Admit: 2023-10-08 | Discharge: 2023-10-08 | Disposition: A | Discharge status: Home / Self Care | Source: Ambulatory Visit | Attending: Internal Medicine | Admitting: Internal Medicine

## 2023-10-08 DIAGNOSIS — R928 Other abnormal and inconclusive findings on diagnostic imaging of breast: Secondary | ICD-10-CM | POA: Diagnosis present

## 2023-10-08 DIAGNOSIS — N63 Unspecified lump in unspecified breast: Secondary | ICD-10-CM | POA: Diagnosis present

## 2023-10-08 DIAGNOSIS — D242 Benign neoplasm of left breast: Secondary | ICD-10-CM | POA: Insufficient documentation

## 2023-10-08 HISTORY — PX: BREAST BIOPSY: SHX20

## 2023-10-08 MED ORDER — LIDOCAINE-EPINEPHRINE 1 %-1:100000 IJ SOLN
8.0000 mL | Freq: Once | INTRAMUSCULAR | Status: AC
  Administered 2023-10-08: 8 mL
  Filled 2023-10-08: qty 8

## 2023-10-08 MED ORDER — LIDOCAINE 1 % OPTIME INJ - NO CHARGE
2.0000 mL | Freq: Once | INTRAMUSCULAR | Status: AC
  Administered 2023-10-08: 2 mL
  Filled 2023-10-08: qty 2

## 2023-10-11 ENCOUNTER — Encounter: Payer: Self-pay | Admitting: Internal Medicine

## 2023-10-11 ENCOUNTER — Encounter: Payer: Self-pay | Admitting: *Deleted

## 2023-10-11 LAB — SURGICAL PATHOLOGY

## 2023-10-11 NOTE — Progress Notes (Signed)
 Referral recieved from [facility] for benign breast mass.  Vm left asking for return call to discuss surgical consultation.

## 2023-10-14 ENCOUNTER — Encounter: Payer: Self-pay | Admitting: *Deleted

## 2023-10-14 NOTE — Progress Notes (Signed)
 Called 2nd tim to discuss if she would like surgical referral as recommended by radiology for the left breast papilloma.   No answer, left VM asking for return call.

## 2023-10-15 ENCOUNTER — Other Ambulatory Visit: Payer: Self-pay

## 2023-10-15 ENCOUNTER — Encounter: Payer: Self-pay | Admitting: *Deleted

## 2023-10-15 NOTE — Progress Notes (Signed)
 Ms. Natalie Barr does not want to meet with a surgeon at this time.   She will let her PCP know if she changes her mind.  Navigation will sign off.

## 2024-04-25 ENCOUNTER — Other Ambulatory Visit: Payer: Self-pay

## 2024-04-25 ENCOUNTER — Telehealth: Payer: Self-pay

## 2024-04-25 DIAGNOSIS — Z860100 Personal history of colon polyps, unspecified: Secondary | ICD-10-CM

## 2024-04-25 MED ORDER — NA SULFATE-K SULFATE-MG SULF 17.5-3.13-1.6 GM/177ML PO SOLN: 1.0000 | Freq: Once | ORAL | 0 refills | Status: AC

## 2024-04-25 NOTE — Telephone Encounter (Signed)
 Gastroenterology Pre-Procedure Review  Request Date: 06/14/24 Requesting Physician: Dr. Melany  PATIENT REVIEW QUESTIONS: The patient responded to the following health history questions as indicated:    1. Are you having any GI issues? no 2. Do you have a personal history of Polyps? yes (last colonoscopy performed by Dr. Unk 03/06/19 recommended repeat in 5 years) 3. Do you have a family history of Colon Cancer or Polyps? yes (sister colon polyps) 4. Diabetes Mellitus? no 5. Joint replacements in the past 12 months?no 6. Major health problems in the past 3 months?no 7. Any artificial heart valves, MVP, or defibrillator?no    MEDICATIONS & ALLERGIES:    Patient reports the following regarding taking any anticoagulation/antiplatelet therapy:   Plavix, Coumadin, Eliquis, Xarelto, Lovenox, Pradaxa, Brilinta, or Effient? no Aspirin? no  Patient confirms/reports the following medications:  Current Outpatient Medications  Medication Sig Dispense Refill   Calcium Carbonate-Vit D-Min (CALCIUM 1200 PO) Take 1 capsule by mouth as needed. (Patient not taking: Reported on 09/08/2023)     cholecalciferol (VITAMIN D3) 25 MCG (1000 UNIT) tablet Take 1,000 Units by mouth as needed. (Patient not taking: Reported on 09/08/2023)     meloxicam (MOBIC) 15 MG tablet Take 15 mg by mouth as needed. (Patient not taking: Reported on 09/08/2023)     Multiple Vitamin (MULTIVITAMIN) tablet Take 1 tablet by mouth daily. (Patient not taking: Reported on 09/08/2023)     nystatin  ointment (MYCOSTATIN ) Apply 1 Application topically 2 (two) times daily. 30 g 0   No current facility-administered medications for this visit.    Patient confirms/reports the following allergies:  No Known Allergies  No orders of the defined types were placed in this encounter.   AUTHORIZATION INFORMATION Primary Insurance: 1D#: Group #:  Secondary Insurance: 1D#: Group #:  SCHEDULE INFORMATION: Date: 06/14/24 Time: Location:  [facility]

## 2024-06-05 ENCOUNTER — Encounter: Payer: Self-pay | Admitting: Gastroenterology

## 2024-06-05 NOTE — Anesthesia Preprocedure Evaluation (Addendum)
"                                    Anesthesia Evaluation  Patient identified by MRN, date of birth, ID band Patient awake    Reviewed: Allergy & Precautions, H&P , NPO status , Patient's Chart, lab work & pertinent test results  Airway Mallampati: II  TM Distance: >3 FB Neck ROM: Full    Dental no notable dental hx. (+) Caps No caps in front, may have caps, or just fillings, elsewhere:   Pulmonary neg pulmonary ROS   Pulmonary exam normal breath sounds clear to auscultation       Cardiovascular hypertension, negative cardio ROS Normal cardiovascular exam Rhythm:Regular Rate:Normal     Neuro/Psych negative neurological ROS  negative psych ROS   GI/Hepatic negative GI ROS, Neg liver ROS,,,  Endo/Other  negative endocrine ROS    Renal/GU negative Renal ROS  negative genitourinary   Musculoskeletal negative musculoskeletal ROS (+)    Abdominal   Peds negative pediatric ROS (+)  Hematology negative hematology ROS (+)   Anesthesia Other Findings History of colon polyps  Reproductive/Obstetrics negative OB ROS                              Anesthesia Physical Anesthesia Plan  ASA: 1  Anesthesia Plan: General   Post-op Pain Management:    Induction: Intravenous  PONV Risk Score and Plan:   Airway Management Planned: Natural Airway and Nasal Cannula  Additional Equipment:   Intra-op Plan:   Post-operative Plan:   Informed Consent: I have reviewed the patients History and Physical, chart, labs and discussed the procedure including the risks, benefits and alternatives for the proposed anesthesia with the patient or authorized representative who has indicated his/her understanding and acceptance.     Dental Advisory Given  Plan Discussed with: Anesthesiologist, CRNA and Surgeon  Anesthesia Plan Comments: (Patient consented for risks of anesthesia including but not limited to:  - adverse reactions to  medications - risk of airway placement if required - damage to eyes, teeth, lips or other oral mucosa - nerve damage due to positioning  - sore throat or hoarseness - Damage to heart, brain, nerves, lungs, other parts of body or loss of life  Patient voiced understanding and assent.)         Anesthesia Quick Evaluation  "

## 2024-06-12 ENCOUNTER — Encounter: Payer: Self-pay | Admitting: Gastroenterology

## 2024-06-14 ENCOUNTER — Ambulatory Visit: Payer: Self-pay | Admitting: Anesthesiology

## 2024-06-14 ENCOUNTER — Encounter
Admission: RE | Disposition: A | Discharge status: Home / Self Care | Payer: Self-pay | Source: Home / Self Care | Attending: Gastroenterology

## 2024-06-14 ENCOUNTER — Ambulatory Visit
Admission: RE | Admit: 2024-06-14 | Discharge: 2024-06-14 | Disposition: A | Discharge status: Home / Self Care | Source: Home / Self Care | Attending: Gastroenterology | Admitting: Gastroenterology

## 2024-06-14 ENCOUNTER — Encounter: Payer: Self-pay | Admitting: Gastroenterology

## 2024-06-14 ENCOUNTER — Other Ambulatory Visit: Payer: Self-pay

## 2024-06-14 DIAGNOSIS — Z1211 Encounter for screening for malignant neoplasm of colon: Secondary | ICD-10-CM | POA: Diagnosis not present

## 2024-06-14 DIAGNOSIS — I10 Essential (primary) hypertension: Secondary | ICD-10-CM | POA: Diagnosis not present

## 2024-06-14 DIAGNOSIS — K640 First degree hemorrhoids: Secondary | ICD-10-CM | POA: Diagnosis not present

## 2024-06-14 DIAGNOSIS — K644 Residual hemorrhoidal skin tags: Secondary | ICD-10-CM | POA: Diagnosis not present

## 2024-06-14 DIAGNOSIS — Z860100 Personal history of colon polyps, unspecified: Secondary | ICD-10-CM | POA: Diagnosis present

## 2024-06-14 DIAGNOSIS — Z860101 Personal history of adenomatous and serrated colon polyps: Secondary | ICD-10-CM | POA: Diagnosis not present

## 2024-06-14 DIAGNOSIS — Z8249 Family history of ischemic heart disease and other diseases of the circulatory system: Secondary | ICD-10-CM | POA: Diagnosis not present

## 2024-06-14 HISTORY — PX: COLONOSCOPY: SHX5424

## 2024-06-14 HISTORY — DX: Personal history of colon polyps, unspecified: Z86.0100

## 2024-06-14 SURGERY — COLONOSCOPY
Anesthesia: General | Site: Rectum

## 2024-06-14 MED ORDER — LACTATED RINGERS IV SOLN: INTRAVENOUS | Status: DC

## 2024-06-14 MED ORDER — SODIUM CHLORIDE 0.9 % IV SOLN: INTRAVENOUS | Status: DC

## 2024-06-14 MED ORDER — PROPOFOL 10 MG/ML IV BOLUS
INTRAVENOUS | Status: DC | PRN
  Administered 2024-06-14: 50 mg via INTRAVENOUS
  Administered 2024-06-14: 100 mg via INTRAVENOUS
  Administered 2024-06-14: 50 mg via INTRAVENOUS

## 2024-06-14 MED ORDER — LIDOCAINE HCL (CARDIAC) PF 100 MG/5ML IV SOSY
PREFILLED_SYRINGE | INTRAVENOUS | Status: DC | PRN
  Administered 2024-06-14: 50 mg via INTRAVENOUS

## 2024-06-14 MED ORDER — STERILE WATER FOR IRRIGATION IR SOLN
Status: DC | PRN
  Administered 2024-06-14: 250 mL

## 2024-06-14 SURGICAL SUPPLY — 20 items
CLIP HMST 235XBRD CATH ROT (MISCELLANEOUS) IMPLANT
ELECTRODE REM PT RTRN 9FT ADLT (ELECTROSURGICAL) IMPLANT
FORCEPS BIOP RAD 4 LRG CAP 4 (CUTTING FORCEPS) IMPLANT
GAUZE SPONGE 4X4 12PLY STRL (GAUZE/BANDAGES/DRESSINGS) IMPLANT
GOWN CVR UNV OPN BCK APRN NK (MISCELLANEOUS) ×2 IMPLANT
INJECTOR VARIJECT VIN23 (MISCELLANEOUS) IMPLANT
KIT DEFENDO VALVE AND CONN (KITS) IMPLANT
KIT PROCEDURE OLYMPUS (MISCELLANEOUS) ×1 IMPLANT
MANIFOLD NEPTUNE II (INSTRUMENTS) ×1 IMPLANT
MARKER SPOT ENDO TATTOO 5ML (MISCELLANEOUS) IMPLANT
PROBE APC STR FIRE (PROBE) IMPLANT
RETRIEVER NET ROTH 2.5X230 LF (MISCELLANEOUS) IMPLANT
SNARE COLD EXACTO (MISCELLANEOUS) IMPLANT
SNARE LASSO HEX 3 IN 1 (INSTRUMENTS) IMPLANT
SNARE SHORT THROW 13M SML OVAL (MISCELLANEOUS) IMPLANT
SNARE SNG USE RND 15MM (INSTRUMENTS) IMPLANT
STRAP BODY AND KNEE 60X3 (MISCELLANEOUS) IMPLANT
SYR 50ML SLIP (SYRINGE) IMPLANT
TRAP ETRAP POLY (MISCELLANEOUS) IMPLANT
WATER STERILE IRR 250ML POUR (IV SOLUTION) ×1 IMPLANT

## 2024-06-14 NOTE — Anesthesia Postprocedure Evaluation (Signed)
"   Anesthesia Post Note  Patient: Natalie Barr  Procedure(s) Performed: COLONOSCOPY (Rectum)  Patient location during evaluation: PACU Anesthesia Type: General Level of consciousness: awake and alert Pain management: pain level controlled Vital Signs Assessment: post-procedure vital signs reviewed and stable Respiratory status: spontaneous breathing, nonlabored ventilation, respiratory function stable and patient connected to nasal cannula oxygen Cardiovascular status: blood pressure returned to baseline and stable Postop Assessment: no apparent nausea or vomiting Anesthetic complications: no   No notable events documented.   Last Vitals:  Vitals:   06/14/24 0901 06/14/24 0905  BP:  115/81  Pulse: 71 70  Resp: 16 11  Temp:    SpO2: 97% 97%    Last Pain:  Vitals:   06/14/24 0905  TempSrc:   PainSc: 0-No pain                 Haidynn Almendarez C Tiffiny Worthy      "

## 2024-06-14 NOTE — Transfer of Care (Signed)
 Immediate Anesthesia Transfer of Care Note  Patient: Natalie Barr  Procedure(s) Performed: COLONOSCOPY (Rectum)  Patient Location: PACU  Anesthesia Type: General  Level of Consciousness: awake, alert  and patient cooperative  Airway and Oxygen Therapy: Patient Spontanous Breathing and Patient connected to supplemental oxygen  Post-op Assessment: Post-op Vital signs reviewed, Patient's Cardiovascular Status Stable, Respiratory Function Stable, Patent Airway and No signs of Nausea or vomiting  Post-op Vital Signs: Reviewed and stable  Complications: No notable events documented.

## 2024-06-14 NOTE — Op Note (Signed)
 [facility] Gastroenterology Patient Name: Natalie Barr Procedure Date: 06/14/2024 8:32 AM MRN: [id number] Account #: [id number] Date of Birth: [birth year] Admit Type: Outpatient Age: 58 Room: [facility] OR ROOM 01 Gender: Female Note Status: Finalized Instrument Name: Colonoscope [id number] Procedure:             Colonoscopy Indications:           High risk colon cancer surveillance: Personal history                         of colonic polyps Providers:             Clotilda Schaffer, MD Referring MD:          Clotilda Schaffer, MD (Referring MD), Verneita Kettering, MD                         (Referring MD) Medicines:             Propofol  per Anesthesia Complications:         No immediate complications. Procedure:             Pre-Anesthesia Assessment:                        - Prior to the procedure, a History and Physical was                         performed, and patient medications and allergies were                         reviewed. The patient's tolerance of previous                         anesthesia was also reviewed. The risks and benefits                         of the procedure and the sedation options and risks                         were discussed with the patient. All questions were                         answered, and informed consent was obtained. Prior                         Anticoagulants: The patient has taken no anticoagulant                         or antiplatelet agents. ASA Grade Assessment: II - A                         patient with mild systemic disease. After reviewing                         the risks and benefits, the patient was deemed in                         satisfactory condition to undergo the procedure.  After obtaining informed consent, the colonoscope was                         passed under direct vision. Throughout the procedure,                         the patient's blood pressure, pulse, and oxygen                          saturations were monitored continuously. The                         Colonoscope was introduced through the anus and                         advanced to the 10 cm into the ileum. The colonoscopy                         was performed without difficulty. The patient                         tolerated the procedure well. The quality of the bowel                         preparation was good. The terminal ileum, ileocecal                         valve, appendiceal orifice, and rectum were                         photographed. Findings:      External and internal hemorrhoids were found. The hemorrhoids were Grade       I (internal hemorrhoids that do not prolapse).      The terminal ileum appeared normal. Impression:            - External and internal hemorrhoids.                        - The examined portion of the ileum was normal.                        - No specimens collected. Recommendation:        - Patient has a contact number available for                         emergencies. The signs and symptoms of potential                         delayed complications were discussed with the patient.                         Return to normal activities tomorrow. Written                         discharge instructions were provided to the patient.                        - High fiber diet.                        -  Continue present medications.                        - Await pathology results.                        - Repeat colonoscopy in 10 days for screening purposes.                        - The findings and recommendations were discussed with                         the designated responsible adult. Procedure Code(s):     --- Professional ---                        H9894, Colorectal cancer screening; colonoscopy on                         individual at high risk Diagnosis Code(s):     --- Professional ---                        Z86.010, Personal history of colonic polyps CPT copyright 2022  American Medical Association. All rights reserved. The codes documented in this report are preliminary and upon coder review may  be revised to meet current compliance requirements. Clotilda Schaffer, MD 06/14/2024 9:00:14 AM Number of Addenda: 0 Note Initiated On: 06/14/2024 8:32 AM Scope Withdrawal Time: 0 hours 6 minutes 7 seconds  Total Procedure Duration: 0 hours 10 minutes 6 seconds  Estimated Blood Loss:  Estimated blood loss: none.      [facility]

## 2024-06-14 NOTE — H&P (Signed)
 "  Natalie Schaffer, MD  [address] [place], [place] [address] Phone: [phone] Fax : [phone]  Primary Care Physician:  Natalie Verneita CROME, MD Primary Gastroenterologist:  Dr. Schaffer  Pre-Procedure History & Physical: HPI:  Natalie Barr is a 58 y.o. female is here for a screening colonoscopy.  Prior colonoscopy? 2020, when 2 Aps were removed Fhx CRC? No Blood thinners? No  Past Medical History:  Diagnosis Date   Chicken pox as a child   History of colon polyps     Past Surgical History:  Procedure Laterality Date   BREAST BIOPSY Left 10/08/2023   US  LT BREAST BX W LOC DEV 1ST LESION IMG BX SPEC US  GUIDE 10/08/2023 [facility]   CESAREAN SECTION  1994   COLONOSCOPY WITH PROPOFOL  N/A 03/06/2019   Procedure: COLONOSCOPY WITH PROPOFOL ;  Surgeon: Unk Corinn Skiff, MD;  Location: [facility] END[facility];  Service: Gastroenterology;  Laterality: N/A;   WISDOM TOOTH EXTRACTION  1985    Prior to Admission medications  Medication Sig Start Date End Date Taking? Authorizing Provider  meloxicam (MOBIC) 15 MG tablet Take 15 mg by mouth as needed. 04/18/21   [provider]  nystatin  ointment (MYCOSTATIN ) Apply 1 Application topically 2 (two) times daily. 08/06/22   Natalie Verneita CROME, MD    Allergies as of 04/25/2024   (No Known Allergies)    Family History  Problem Relation Age of Onset   Cancer Father        prostate   Dementia Maternal Grandmother    Heart attack Maternal Grandfather    Heart attack Paternal Grandfather     Social History   Socioeconomic History   Marital status: Married    Spouse name: Not on file   Number of children: Not on file   Years of education: Not on file   Highest education level: Not on file  Occupational History   Not on file  Tobacco Use   Smoking status: Never   Smokeless tobacco: Never  Vaping Use   Vaping status: Never Used  Substance and Sexual Activity   Alcohol use: No   Drug use: No   Sexual activity: Yes     Partners: Male  Other Topics Concern   Not on file  Social History Narrative   Not on file   Social Drivers of Health   Tobacco Use: Low Risk (06/14/2024)   Patient History    Smoking Tobacco Use: Never    Smokeless Tobacco Use: Never    Passive Exposure: Not on file  Financial Resource Strain: Not on file  Food Insecurity: Not on file  Transportation Needs: Not on file  Physical Activity: Not on file  Stress: Not on file  Social Connections: Not on file  Intimate Partner Violence: Not on file  Depression (PHQ2-9): Low Risk (09/08/2023)   Depression (PHQ2-9)    PHQ-2 Score: 0  Alcohol Screen: Not on file  Housing: Not on file  Utilities: Not on file  Health Literacy: Not on file    Review of Systems: See HPI, otherwise negative ROS  Physical Exam: Ht 5' 4 (1.626 m)   Wt 93.9 kg   LMP 11/26/2016   BMI 35.53 kg/m  CONSTITUTIONAL: Well-appearing in no acute distress.  HEENT: Pupils equal, round, Extraocular movements intact. Conjunctivae clear NECK: Neck supple CARDIOVASCULAR: Regular rate, no LE edema  RESPIRATORY: No labored breathing  ABDOMEN: Abdomen soft, nontender, not distended, no guarding, no rigidity SKIN: No apparent skin rashes or lesions. NEUROLOGIC: Normal speech,  no focal findings. Mental status alert and oriented x4. PSYCHIATRIC: Mood and affect normal.   Impression/Plan: Natalie Barr is now here to undergo a screening colonoscopy.  Risks, benefits, and alternatives regarding colonoscopy have been reviewed with the patient.  Questions have been answered.  All parties agreeable.  "

## 2024-06-20 ENCOUNTER — Ambulatory Visit: Payer: Self-pay

## 2024-06-20 NOTE — Telephone Encounter (Signed)
 FYI Only or Action Required?: FYI only for provider: appointment scheduled on 06/21/24.  Patient was last seen in primary care on 09/08/2023 by Marylynn Verneita CROME, MD.  Called Nurse Triage reporting Hematuria.  Symptoms began about a month ago.  Interventions attempted: Other: maintaining normal fluid intake .  Symptoms are: stable.  Triage Disposition: See Physician Within 24 Hours  Patient/caregiver understands and will follow disposition?: Yes        Reason for Disposition  Blood in urine  (Exception: Could be normal menstrual bleeding.)  Answer Assessment - Initial Assessment Questions Patient reports about 1 month ago had dark tissue out of uterusonly happening when pees. Doesn't happen every time, maybe every other day. Sometimes looks like tissue , clump in bottom doesn't look like blood, but when wipes it pink tinge sometimes. Wouldn't know anything was different expect the pink tinge, at first thought was related to intercourse and would go away but didn't go away. Wants to be seen. Also reports is wondering if due for another pap per chart was 2023. States feeling fine just these symptoms. Booked appointment tomorrow at 40 Am alternative provider at patient primary care office.      1. COLOR of URINE: Describe the color of the urine.  (e.g., tea-colored, pink, red, bloody) Do you have blood clots in your urine? (e.g., none, pea, grape, small coin)     Pink tinge sometimes  2. ONSET: When did the bleeding start?      About 1 month ago  3. EPISODES: How many times has there been blood in the urine? or How many times today?     Not every time  4. PAIN with URINATION: Is there any pain with passing your urine? If Yes, ask: How bad is the pain?  (Scale 1-10; or mild, moderate, severe)     Denies  5. FEVER: Do you have a fever? If Yes, ask: What is your temperature, how was it measured, and when did it start?     Denies  6. ASSOCIATED SYMPTOMS: Are you  passing urine more frequently than usual?     Denies  7. OTHER SYMPTOMS: Do you have any other symptoms? (e.g., back/flank pain, abdomen pain, vomiting)     Pink tinge when wiping after peeing, and noticing that there is white clumps in toilet feels its tissue  from uterus , no blood spotting on pad or underwear of blood   Patient denies chest pain, shortness of breath, vomiting, abdominal pain, chills, fever , dysuria  Protocols used: Urine - Blood In-A-AH Copied from CRM #[id number]. Topic: Clinical - Red Word Triage >> Jun 20, 2024  1:49 PM China J wrote: [facility] that prompted transfer to Nurse Triage: Patient is post menopausal and is having blood tissue come out of her. There is no spotting and it does not happen unless she's urinating. This happens every other day with the last time being this morning. She is wanting a referral if possible.

## 2024-06-21 ENCOUNTER — Ambulatory Visit: Admitting: Family

## 2024-06-21 ENCOUNTER — Encounter: Payer: Self-pay | Admitting: Family

## 2024-06-21 ENCOUNTER — Other Ambulatory Visit (HOSPITAL_COMMUNITY)
Admission: RE | Admit: 2024-06-21 | Discharge: 2024-06-21 | Disposition: A | Discharge status: Home / Self Care | Source: Ambulatory Visit | Attending: Family | Admitting: Family

## 2024-06-21 VITALS — BP 130/70 | HR 74 | Temp 97.8°F | Ht 64.0 in | Wt 207.0 lb

## 2024-06-21 DIAGNOSIS — N950 Postmenopausal bleeding: Secondary | ICD-10-CM | POA: Insufficient documentation

## 2024-06-21 DIAGNOSIS — Z124 Encounter for screening for malignant neoplasm of cervix: Secondary | ICD-10-CM

## 2024-06-21 NOTE — Patient Instructions (Signed)
 Trans vaginal ultrasound is ordered.  Let us  know if you dont hear back within 2 weeks in regards to an appointment being scheduled.   So that you are aware, if you are [facility] MyChart user , please pay attention to your MyChart messages as you may receive a MyChart message with a phone number to call and schedule this test/appointment own your own from our referral coordinator. This is a new process so I do not want you to miss this message.  If you are not a MyChart user, you will receive a phone call.    Nice to meet you

## 2024-06-21 NOTE — Progress Notes (Signed)
 "  Assessment & Plan:  Postmenopausal bleeding Assessment & Plan: Concern for postmenopausal bleeding.  She is hemodynamically stable.  Bright red blood seen coming from cervical os.  Pap smear obtained.  Pending wet prep, transvaginal ultrasound.  Likely patient will require OB/GYN consult.  Will await transvaginal ultrasound results.  Orders: -     Cytology - PAP -     US  PELVIC COMPLETE WITH TRANSVAGINAL; Future -     Cervicovaginal ancillary only  Screening for cervical cancer -     Cytology - PAP     Return precautions given.   Risks, benefits, and alternatives of the medications and treatment plan prescribed today were discussed, and patient expressed understanding.   Education regarding symptom management and diagnosis given to patient on AVS either electronically or printed.  No follow-ups on file.  Rollene Northern, FNP  Subjective:    Patient ID: Natalie Barr, female    DOB: 1966-02-27, 59 y.o.   MRN: [id number]  CC: Natalie Barr is a 59 y.o. female who presents today for an acute visit.    HPI: HPI Discussed the use of AI scribe software for clinical note transcription with the patient, who gave verbal consent to proceed.  History of Present Illness   Natalie Barr is a 59 year old female who presents with brown vaginal discharge.  She experiences brown vaginal discharge that is sometimes clumpy, about the size of a nickel, and occasionally stringy, resembling tissue. The discharge occurs every other day, with the most recent episode being yesterday morning. It is only present during urination and is not visible in her underwear.  Denies vaginal itching, burning, or pain. The discharge began after an episode of sexual intercourse in a different position, which she initially thought might have caused the issue. However, the symptoms have persisted for about a month.  She is postmenopausal, having not had a period for at least two years. She has undergone a  colonoscopy and has a history of internal and external hemorrhoids, but has not observed any bright red blood or bleeding from the rectum. Denies visible blood in her urine.   Colonoscopy up-to-date, follow-up already 07/05/2023.  Repeat in 10 years Never smoker Pap smear 07/31/2021, negative malignancy, negative HPV  Allergies: Patient has no known allergies. Medications Ordered Prior to Encounter[1]  Review of Systems  Constitutional:  Negative for chills and fever.  Respiratory:  Negative for cough.   Cardiovascular:  Negative for chest pain and palpitations.  Gastrointestinal:  Negative for abdominal pain, nausea and vomiting.  Genitourinary:  Positive for vaginal bleeding. Negative for difficulty urinating, flank pain, hematuria, menstrual problem, vaginal discharge and vaginal pain.      Objective:    BP 130/70   Pulse 74   Temp 97.8 F (36.6 C) (Oral)   Ht 5' 4 (1.626 m)   Wt 207 lb (93.9 kg)   LMP 11/26/2016   SpO2 99%   BMI 35.53 kg/m   BP Readings from Last 3 Encounters:  06/21/24 130/70  06/14/24 115/81  09/08/23 (!) 142/90   Wt Readings from Last 3 Encounters:  06/21/24 207 lb (93.9 kg)  06/14/24 208 lb 9.6 oz (94.6 kg)  09/08/23 207 lb 9.6 oz (94.2 kg)    Physical Exam Vitals reviewed.  Constitutional:      Appearance: She is well-developed.  Eyes:     Conjunctiva/sclera: Conjunctivae normal.  Cardiovascular:     Rate and Rhythm: Normal rate and regular rhythm.  Pulses: Normal pulses.     Heart sounds: Normal heart sounds.  Pulmonary:     Effort: Pulmonary effort is normal.     Breath sounds: Normal breath sounds. No wheezing, rhonchi or rales.  Genitourinary:    Labia:        Right: No rash, tenderness or lesion.        Left: No rash, tenderness or lesion.      Vagina: No foreign body. Bleeding present. No vaginal discharge, erythema or tenderness.     Cervix: Cervical bleeding present. No cervical motion tenderness or discharge.      Uterus: Not enlarged and not tender.      Adnexa:        Right: No mass, tenderness or fullness.         Left: No mass, tenderness or fullness.       Comments: No vulvovaginal erythema. No lesions.Scant amount of mucous and bright red blood coming from cervical os.  Pap smear obtained.   Skin:    General: Skin is warm and dry.  Neurological:     Mental Status: She is alert.  Psychiatric:        Speech: Speech normal.        Behavior: Behavior normal.        Thought Content: Thought content normal.           [1]  Current Outpatient Medications on File Prior to Visit  Medication Sig Dispense Refill   meloxicam (MOBIC) 15 MG tablet Take 15 mg by mouth as needed.     nystatin  ointment (MYCOSTATIN ) Apply 1 Application topically 2 (two) times daily. 30 g 0   No current facility-administered medications on file prior to visit.   "

## 2024-06-22 ENCOUNTER — Ambulatory Visit
Admission: RE | Admit: 2024-06-22 | Discharge: 2024-06-22 | Disposition: A | Discharge status: Home / Self Care | Source: Ambulatory Visit | Attending: Family | Admitting: Family

## 2024-06-22 DIAGNOSIS — N950 Postmenopausal bleeding: Secondary | ICD-10-CM | POA: Diagnosis present

## 2024-06-22 LAB — CERVICOVAGINAL ANCILLARY ONLY
Bacterial Vaginitis (gardnerella): NEGATIVE
Candida Glabrata: NEGATIVE
Candida Vaginitis: NEGATIVE
Chlamydia: NEGATIVE
Comment: NEGATIVE
Comment: NEGATIVE
Comment: NEGATIVE
Comment: NEGATIVE
Comment: NEGATIVE
Comment: NORMAL
Neisseria Gonorrhea: NEGATIVE
Trichomonas: NEGATIVE

## 2024-06-22 LAB — CYTOLOGY - PAP
Comment: NEGATIVE
Diagnosis: NEGATIVE
High risk HPV: NEGATIVE

## 2024-06-22 NOTE — Assessment & Plan Note (Signed)
 Concern for postmenopausal bleeding.  She is hemodynamically stable.  Bright red blood seen coming from cervical os.  Pap smear obtained.  Pending wet prep, transvaginal ultrasound.  Likely patient will require OB/GYN consult.  Will await transvaginal ultrasound results.

## 2024-06-26 ENCOUNTER — Ambulatory Visit: Payer: Self-pay | Admitting: Family

## 2024-06-26 DIAGNOSIS — N950 Postmenopausal bleeding: Secondary | ICD-10-CM

## 2024-06-30 ENCOUNTER — Telehealth: Payer: Self-pay | Admitting: Family

## 2024-06-30 NOTE — Telephone Encounter (Signed)
 Spoke with pt today  Reviewed transvaginal ultrasound.  She still having scant vaginal bleeding.  Discussed urgency to see GYN.  Referral placed today

## 2024-07-05 ENCOUNTER — Encounter: Payer: Self-pay | Admitting: Internal Medicine

## 2024-07-10 ENCOUNTER — Encounter: Admitting: Obstetrics & Gynecology

## 2024-07-12 ENCOUNTER — Encounter: Payer: Self-pay | Admitting: Obstetrics & Gynecology

## 2024-07-12 ENCOUNTER — Ambulatory Visit (INDEPENDENT_AMBULATORY_CARE_PROVIDER_SITE_OTHER): Admitting: Obstetrics & Gynecology

## 2024-07-12 VITALS — BP 122/79 | HR 114 | Ht 64.0 in | Wt 211.0 lb

## 2024-07-12 DIAGNOSIS — N950 Postmenopausal bleeding: Secondary | ICD-10-CM

## 2024-07-12 DIAGNOSIS — R9389 Abnormal findings on diagnostic imaging of other specified body structures: Secondary | ICD-10-CM | POA: Diagnosis not present

## 2024-07-12 NOTE — Progress Notes (Signed)
" ° ° °  GYNECOLOGY PROGRESS NOTE  Subjective:    Patient ID: Natalie Barr, female    DOB: Sep 11, 1965, 59 y.o.   MRN: [id number]  HPI  Patient is a 59 y.o. [phone] female who presents for evaluation of PMB 11/12025. She contacted her primary care provider and a pelvic ultrasound was done. This showed her endometrium to be 2 cm.  The following portions of the patient's history were reviewed and updated as appropriate: allergies, current medications, past family history, past medical history, past social history, past surgical history, and problem list.  Review of Systems Pertinent items are noted in HPI.   Objective:   Blood pressure 122/79, pulse (!) 114, height 5' 4 (1.626 m), weight 211 lb (95.7 kg), last menstrual period 11/26/2016. Body mass index is 36.22 kg/m. Well nourished, well hydrated White female, no apparent distress She is ambulating and conversing normally.    Assessment:   PMB, thickened endometrium  Plan:   I offered EMBX today but did tell her that if the pathology is negative that I would recommend a d&c. She would prefer to go straight to [facility] and avoid the [facility]. Dr. Leigh will do her d&c in the near future. "

## 2024-07-13 ENCOUNTER — Other Ambulatory Visit: Payer: Self-pay | Admitting: Obstetrics

## 2024-07-17 ENCOUNTER — Other Ambulatory Visit: Payer: Self-pay

## 2024-07-17 ENCOUNTER — Inpatient Hospital Stay
Admission: RE | Admit: 2024-07-17 | Discharge: 2024-07-17 | Disposition: A | Source: Ambulatory Visit | Attending: Obstetrics

## 2024-07-24 ENCOUNTER — Encounter: Payer: Self-pay | Admitting: Urgent Care

## 2024-07-24 ENCOUNTER — Ambulatory Visit: Admission: RE | Admit: 2024-07-24 | Admitting: Obstetrics

## 2024-07-24 ENCOUNTER — Encounter: Admission: RE | Payer: Self-pay | Source: Home / Self Care

## 2024-08-08 ENCOUNTER — Encounter: Admitting: Obstetrics & Gynecology

## 2024-08-15 ENCOUNTER — Encounter: Admitting: Obstetrics

## 2024-09-13 ENCOUNTER — Encounter: Admitting: Internal Medicine
